# Patient Record
Sex: Male | Born: 1990 | Race: Black or African American | Hispanic: No | Marital: Single | Smoking: Former smoker
Health system: Southern US, Community
[De-identification: ages and names within clinical notes are randomized; demographics above are authoritative.]

---
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
## 2010-12-04 NOTE — Op Note (Signed)
NAME:  Aaron Shepard, Aaron Shepard NO.:  [id number]   MEDICAL RECORD NO.:  [id number]                   PATIENT TYPE:  AMB   LOCATION:  [facility]                                 FACILITY:  [facility]   PHYSICIAN:  [place] Service, M.D.             DATE OF BIRTH:  [birth year]   DATE OF PROCEDURE:  09/06/2003  DATE OF DISCHARGE:                                 OPERATIVE REPORT   PEDIATRICIAN:  Marda Stalker, M.D.   UROLOGIST:  [place] Service, M.D.   PREOPERATIVE DIAGNOSES:  Thin deflected urinary stream with dysuria,  frequency and a poorly emptying bladder. Physical exam shows meatal  stenosis, the patient also has nocturnal and uresis.   POSTOPERATIVE DIAGNOSES:  Same.   PROCEDURE:  Meatotomy, I&O cath.   ANESTHESIA:  General.   DRAINS:  None.   COMPLICATIONS:  None.   DESCRIPTION OF PROCEDURE:  The patient was prepped and draped in the supine  position after institution of an adequate level of general anesthesia.  Physical exam showed bilaterally descended testes, no evidence of inguinal  hernia.  The patient had a pinpoint urethral meatus that was actually  difficult to identify. Careful palpation with fine hemostats demonstrated  the meatus, it was gently stretched and then the fine pediatric hemostat was  placed along the ventral aspect of the urethral meatus, left in place for  five minutes and then released, replaced with an adult hemostat which was  left in place for five minutes and then released and replaced with an adult  needle driver which was left in place for five minutes and then released.  A  thin strip of avascular tissue had been created along the ventral aspect of  the urethral meatus.  It was incised with fine [place] scissors in order to  create an adequate urethral meatus, no stitches were required.  In and out  cath with a well lubricated 8 French red rubber catheter showed no evidence  of proximal urethral stenosis.  The wound was  covered with bacitracin  ointment.  The patient was returned to recovery in satisfactory condition.                                               Boston Service, M.D.    RH/MEDQ  D:  09/06/2003  T:  09/06/2003  Job:  [id number]   cc:   Marda Stalker, M.D.

## 2010-12-23 ENCOUNTER — Emergency Department (HOSPITAL_COMMUNITY)
Admission: EM | Admit: 2010-12-23 | Discharge: 2010-12-23 | Disposition: A | Discharge status: Home / Self Care | Payer: No Typology Code available for payment source | Attending: Emergency Medicine | Admitting: Emergency Medicine

## 2010-12-23 ENCOUNTER — Emergency Department (HOSPITAL_COMMUNITY): Payer: Self-pay

## 2010-12-23 DIAGNOSIS — S60229A Contusion of unspecified hand, initial encounter: Secondary | ICD-10-CM | POA: Insufficient documentation

## 2010-12-23 DIAGNOSIS — S93409A Sprain of unspecified ligament of unspecified ankle, initial encounter: Secondary | ICD-10-CM | POA: Insufficient documentation

## 2010-12-23 DIAGNOSIS — IMO0002 Reserved for concepts with insufficient information to code with codable children: Secondary | ICD-10-CM | POA: Insufficient documentation

## 2010-12-23 DIAGNOSIS — M25579 Pain in unspecified ankle and joints of unspecified foot: Secondary | ICD-10-CM | POA: Insufficient documentation

## 2011-01-05 ENCOUNTER — Emergency Department (HOSPITAL_COMMUNITY)
Admission: EM | Admit: 2011-01-05 | Discharge: 2011-01-05 | Disposition: A | Discharge status: Home / Self Care | Payer: Self-pay | Attending: Emergency Medicine | Admitting: Emergency Medicine

## 2011-01-05 DIAGNOSIS — L738 Other specified follicular disorders: Secondary | ICD-10-CM | POA: Insufficient documentation

## 2011-01-05 DIAGNOSIS — R21 Rash and other nonspecific skin eruption: Secondary | ICD-10-CM | POA: Insufficient documentation

## 2011-01-05 DIAGNOSIS — IMO0001 Reserved for inherently not codable concepts without codable children: Secondary | ICD-10-CM | POA: Insufficient documentation

## 2011-01-27 ENCOUNTER — Emergency Department (HOSPITAL_COMMUNITY): Payer: Self-pay

## 2011-01-27 ENCOUNTER — Emergency Department (HOSPITAL_COMMUNITY)
Admission: EM | Admit: 2011-01-27 | Discharge: 2011-01-27 | Disposition: A | Discharge status: Home / Self Care | Payer: Self-pay | Attending: Emergency Medicine | Admitting: Emergency Medicine

## 2011-01-27 DIAGNOSIS — L02519 Cutaneous abscess of unspecified hand: Secondary | ICD-10-CM | POA: Insufficient documentation

## 2011-01-27 DIAGNOSIS — Z79899 Other long term (current) drug therapy: Secondary | ICD-10-CM | POA: Insufficient documentation

## 2011-01-27 DIAGNOSIS — L03019 Cellulitis of unspecified finger: Secondary | ICD-10-CM | POA: Insufficient documentation

## 2011-02-08 LAB — DIFFERENTIAL
Basophils Absolute: 0 10*3/uL (ref 0.0–0.1)
Basophils Relative: 0 % (ref 0–1)
Eosinophils Absolute: 0.2 10*3/uL (ref 0.0–0.7)
Eosinophils Relative: 1 % (ref 0–5)
Lymphocytes Relative: 10 % — ABNORMAL LOW (ref 12–46)
Lymphs Abs: 1.7 10*3/uL (ref 0.7–4.0)
Monocytes Absolute: 1.8 10*3/uL — ABNORMAL HIGH (ref 0.1–1.0)
Monocytes Relative: 11 % (ref 3–12)
Neutro Abs: 13.8 10*3/uL — ABNORMAL HIGH (ref 1.7–7.7)
Neutrophils Relative %: 79 % — ABNORMAL HIGH (ref 43–77)

## 2011-02-08 LAB — CK TOTAL AND CKMB (NOT AT ARMC)
CK, MB: 1 ng/mL (ref 0.3–4.0)
Total CK: 24 U/L (ref 7–232)

## 2011-02-08 LAB — CBC
HCT: 29 % — ABNORMAL LOW (ref 39.0–52.0)
Hemoglobin: 9.3 g/dL — ABNORMAL LOW (ref 13.0–17.0)
MCH: 25.5 pg — ABNORMAL LOW (ref 26.0–34.0)
RBC: 3.64 MIL/uL — ABNORMAL LOW (ref 4.22–5.81)

## 2011-02-08 LAB — APTT: aPTT: 173 seconds — ABNORMAL HIGH (ref 24–37)

## 2011-02-08 LAB — PROTIME-INR
INR: 1.41 (ref 0.00–1.49)
Prothrombin Time: 17.5 seconds — ABNORMAL HIGH (ref 11.6–15.2)

## 2013-11-19 ENCOUNTER — Emergency Department (HOSPITAL_COMMUNITY): Payer: No Typology Code available for payment source

## 2013-11-19 ENCOUNTER — Encounter (HOSPITAL_COMMUNITY): Payer: Self-pay | Admitting: Emergency Medicine

## 2013-11-19 DIAGNOSIS — Y929 Unspecified place or not applicable: Secondary | ICD-10-CM | POA: Insufficient documentation

## 2013-11-19 DIAGNOSIS — Y939 Activity, unspecified: Secondary | ICD-10-CM | POA: Insufficient documentation

## 2013-11-19 DIAGNOSIS — S62319A Displaced fracture of base of unspecified metacarpal bone, initial encounter for closed fracture: Secondary | ICD-10-CM | POA: Insufficient documentation

## 2013-11-19 DIAGNOSIS — W2209XA Striking against other stationary object, initial encounter: Secondary | ICD-10-CM | POA: Insufficient documentation

## 2013-11-19 DIAGNOSIS — F17200 Nicotine dependence, unspecified, uncomplicated: Secondary | ICD-10-CM | POA: Insufficient documentation
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
## 2013-11-20 ENCOUNTER — Emergency Department (HOSPITAL_COMMUNITY)
Admission: EM | Admit: 2013-11-20 | Discharge: 2013-11-20 | Disposition: A | Discharge status: Home / Self Care | Payer: Self-pay | Attending: Emergency Medicine | Admitting: Emergency Medicine

## 2013-11-20 DIAGNOSIS — S62300A Unspecified fracture of second metacarpal bone, right hand, initial encounter for closed fracture: Secondary | ICD-10-CM

## 2013-11-20 MED ORDER — HYDROCODONE-ACETAMINOPHEN 5-325 MG PO TABS
1.0000 | ORAL_TABLET | Freq: Once | ORAL | Status: AC
  Administered 2013-11-20: 1 via ORAL
  Filled 2013-11-20: qty 1

## 2013-11-20 MED ORDER — HYDROCODONE-ACETAMINOPHEN 5-325 MG PO TABS: 1.0000 | ORAL_TABLET | ORAL | Status: DC | PRN

## 2013-11-20 NOTE — ED Notes (Signed)
Ortho responded.  

## 2013-11-20 NOTE — ED Notes (Signed)
Ortho paged. 

## 2013-11-20 NOTE — Discharge Instructions (Signed)
Call and make an appointment to followup with the hand surgeon. Return immediately for worsening pain, swelling, numbness, weakness or for any concerns.  Hand Fracture Your caregiver has diagnosed you with a fractured (broken) bone in your hand. If the bones are in good position and the hand is properly immobilized and rested, these injuries will usually heal in 3 to 6 weeks. A cast, splint, or bulky bandage is usually applied to keep the fracture site from moving. Do not remove the splint or cast until your caregiver approves. If the fracture is unstable or the bones are not aligned properly, surgery may be needed. Keep your hand raised (elevated) above the level of your heart as much as possible for the next 2 to 3 days until the swelling and pain are better. Apply ice packs for 15-20 minutes every 3 to 4 hours to help control the pain and swelling. See your caregiver or an orthopedic specialist as directed for follow-up care to make sure the fracture is beginning to heal properly. SEEK IMMEDIATE MEDICAL CARE IF:   You notice your fingers are cold, numb, crooked, or the pain of your injury is severe.  You are not improving or seem to be getting worse.  You have questions or concerns. Document Released: 08/12/2004 Document Revised: 09/27/2011 Document Reviewed: 12/31/2008 [facility] Patient Information 2014 [place], [place].

## 2013-11-20 NOTE — Progress Notes (Signed)
Orthopedic Tech Progress Note Patient Details:  Aaron Shepard 10/11/1990 [id number]  Ortho Devices Type of Ortho Device: Arm sling;Volar splint;Thumb spica splint   [name] 11/20/2013, 2:34 AM

## 2013-11-27 NOTE — ED Provider Notes (Signed)
CSN: [id number]     Arrival date & time 11/19/13  2151 History   First MD Initiated Contact with Patient 11/20/13 0150     Chief Complaint  Patient presents with  . Hand Injury     (Consider location/radiation/quality/duration/timing/severity/associated sxs/prior Treatment) HPI Patient presents with right hand pain after punching a wall yesterday. He's had swelling and pain over the base of the first and second metacarpals. He denies any numbness or tingling. Denies any other injury. History reviewed. No pertinent past medical history. History reviewed. No pertinent past surgical history. No family history on file. History  Substance Use Topics  . Smoking status: Current Every Day Smoker  . Smokeless tobacco: Not on file  . Alcohol Use: No    Review of Systems  Musculoskeletal: Positive for arthralgias. Negative for myalgias, neck pain and neck stiffness.  Skin: Negative for rash and wound.  Neurological: Negative for weakness and numbness.  All other systems reviewed and are negative.     Allergies  Review of patient's allergies indicates no known allergies.  Home Medications   Prior to Admission medications   Medication Sig Start Date End Date Taking? Authorizing Provider  HYDROcodone-acetaminophen (NORCO) 5-325 MG per tablet Take 1 tablet by mouth every 4 (four) hours as needed. 11/20/13   Loren Raceravid Malacai Grantz, MD   BP 124/74  Pulse 89  Temp(Src) 97.9 F (36.6 C) (Oral)  Resp 16  Ht 6' (1.829 m)  Wt 170 lb (77.111 kg)  BMI 23.05 kg/m2  SpO2 99% Physical Exam  Nursing note and vitals reviewed. Constitutional: He is oriented to person, place, and time. He appears well-developed and well-nourished. No distress.  HENT:  Head: Normocephalic and atraumatic.  Eyes: Pupils are equal, round, and reactive to light.  Neck: Normal range of motion. Neck supple.  Cardiovascular: Normal rate and regular rhythm.   Pulmonary/Chest: Effort normal.  Abdominal: Soft.   Musculoskeletal: Normal range of motion. He exhibits tenderness. He exhibits no edema.  Patient with focal tenderness at the base of the right second metacarpal. There is swelling at the site. Distal cap refill intact.  Neurological: He is alert and oriented to person, place, and time.  Range of motion of the right hand and fingers limited due to pain. No focal weakness or numbness noted.  Skin: Skin is warm and dry. No rash noted. No erythema.  Psychiatric: He has a normal mood and affect. His behavior is normal.    ED Course  Procedures (including critical care time) Labs Review Labs Reviewed - No data to display  Imaging Review No results found.   EKG Interpretation None      MDM   Final diagnoses:  Fracture of second metacarpal bone of right hand    Patient splinted and advised to followup with hand surgery. Return precautions given.   Loren Raceravid Heide Brossart, MD 11/27/13 [id number]

## 2013-12-25 ENCOUNTER — Emergency Department (HOSPITAL_COMMUNITY)
Admission: EM | Admit: 2013-12-25 | Discharge: 2013-12-25 | Disposition: A | Discharge status: Home / Self Care | Payer: No Typology Code available for payment source | Attending: Emergency Medicine | Admitting: Emergency Medicine

## 2013-12-25 ENCOUNTER — Encounter (HOSPITAL_COMMUNITY): Payer: Self-pay | Admitting: Emergency Medicine

## 2013-12-25 DIAGNOSIS — S0990XA Unspecified injury of head, initial encounter: Secondary | ICD-10-CM | POA: Insufficient documentation

## 2013-12-25 DIAGNOSIS — S161XXA Strain of muscle, fascia and tendon at neck level, initial encounter: Secondary | ICD-10-CM

## 2013-12-25 DIAGNOSIS — Y9389 Activity, other specified: Secondary | ICD-10-CM | POA: Insufficient documentation

## 2013-12-25 DIAGNOSIS — S139XXA Sprain of joints and ligaments of unspecified parts of neck, initial encounter: Secondary | ICD-10-CM | POA: Insufficient documentation

## 2013-12-25 DIAGNOSIS — F17200 Nicotine dependence, unspecified, uncomplicated: Secondary | ICD-10-CM | POA: Insufficient documentation

## 2013-12-25 DIAGNOSIS — G44209 Tension-type headache, unspecified, not intractable: Secondary | ICD-10-CM

## 2013-12-25 DIAGNOSIS — Y92410 Unspecified street and highway as the place of occurrence of the external cause: Secondary | ICD-10-CM | POA: Insufficient documentation

## 2013-12-25 MED ORDER — CYCLOBENZAPRINE HCL 10 MG PO TABS: 10.0000 mg | ORAL_TABLET | Freq: Two times a day (BID) | ORAL | Status: DC | PRN

## 2013-12-25 MED ORDER — IBUPROFEN 800 MG PO TABS: 800.0000 mg | ORAL_TABLET | Freq: Three times a day (TID) | ORAL | Status: DC
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
## 2013-12-25 NOTE — Discharge Instructions (Signed)
Cervical Sprain °A cervical sprain is an injury in the neck in which the strong, fibrous tissues (ligaments) that connect your neck bones stretch or tear. Cervical sprains can range from mild to severe. Severe cervical sprains can cause the neck vertebrae to be unstable. This can lead to damage of the spinal cord and can result in serious nervous system problems. The amount of time it takes for a cervical sprain to get better depends on the cause and extent of the injury. Most cervical sprains heal in 1 to 3 weeks. °CAUSES  °Severe cervical sprains may be caused by:  °· Contact sport injuries (such as from football, rugby, wrestling, hockey, auto racing, gymnastics, diving, martial arts, or boxing).   °· Motor vehicle collisions.   °· Whiplash injuries. This is an injury from a sudden forward-and backward whipping movement of the head and neck.  °· Falls.   °Mild cervical sprains may be caused by:  °· Being in an awkward position, such as while cradling a telephone between your ear and shoulder.   °· Sitting in a chair that does not offer proper support.   °· Working at a poorly designed computer station.   °· Looking up or down for long periods of time.   °SYMPTOMS  °· Pain, soreness, stiffness, or a burning sensation in the front, back, or sides of the neck. This discomfort may develop immediately after the injury or slowly, 24 hours or more after the injury.   °· Pain or tenderness directly in the middle of the back of the neck.   °· Shoulder or upper back pain.   °· Limited ability to move the neck.   °· Headache.   °· Dizziness.   °· Weakness, numbness, or tingling in the hands or arms.   °· Muscle spasms.   °· Difficulty swallowing or chewing.   °· Tenderness and swelling of the neck.   °DIAGNOSIS  °Most of the time your health care provider can diagnose a cervical sprain by taking your history and doing a physical exam. Your health care provider will ask about previous neck injuries and any known neck  problems, such as arthritis in the neck. X-rays may be taken to find out if there are any other problems, such as with the bones of the neck. Other tests, such as a CT scan or MRI, may also be needed.  °TREATMENT  °Treatment depends on the severity of the cervical sprain. Mild sprains can be treated with rest, keeping the neck in place (immobilization), and pain medicines. Severe cervical sprains are immediately immobilized. Further treatment is done to help with pain, muscle spasms, and other symptoms and may include: °· Medicines, such as pain relievers, numbing medicines, or muscle relaxants.   °· Physical therapy. This may involve stretching exercises, strengthening exercises, and posture training. Exercises and improved posture can help stabilize the neck, strengthen muscles, and help stop symptoms from returning.   °HOME CARE INSTRUCTIONS  °· Put ice on the injured area.   °· Put ice in a plastic bag.   °· Place a towel between your skin and the bag.   °· Leave the ice on for 15 20 minutes, 3 4 times a day.   °· If your injury was severe, you may have been given a cervical collar to wear. A cervical collar is a two-piece collar designed to keep your neck from moving while it heals. °· Do not remove the collar unless instructed by your health care provider. °· If you have long hair, keep it outside of the collar. °· Ask your health care provider before making any adjustments to your collar.   Minor adjustments may be required over time to improve comfort and reduce pressure on your chin or on the back of your head.  Ifyou are allowed to remove the collar for cleaning or bathing, follow your health care provider's instructions on how to do so safely.  Keep your collar clean by wiping it with mild soap and water and drying it completely. If the collar you have been given includes removable pads, remove them every 1 2 days and hand wash them with soap and water. Allow them to air dry. They should be completely  dry before you wear them in the collar.  If you are allowed to remove the collar for cleaning and bathing, wash and dry the skin of your neck. Check your skin for irritation or sores. If you see any, tell your health care provider.  Do not drive while wearing the collar.   Only take over-the-counter or prescription medicines for pain, discomfort, or fever as directed by your health care provider.   Keep all follow-up appointments as directed by your health care provider.   Keep all physical therapy appointments as directed by your health care provider.   Make any needed adjustments to your workstation to promote good posture.   Avoid positions and activities that make your symptoms worse.   Warm up and stretch before being active to help prevent problems.  SEEK MEDICAL CARE IF:   Your pain is not controlled with medicine.   You are unable to decrease your pain medicine over time as planned.   Your activity level is not improving as expected.  SEEK IMMEDIATE MEDICAL CARE IF:   You develop any bleeding.  You develop stomach upset.  You have signs of an allergic reaction to your medicine.   Your symptoms get worse.   You develop new, unexplained symptoms.   You have numbness, tingling, weakness, or paralysis in any part of your body.  MAKE SURE YOU:   Understand these instructions.  Will watch your condition.  Will get help right away if you are not doing well or get worse. Document Released: 05/02/2007 Document Revised: 04/25/2013 Document Reviewed: 01/10/2013 [facility] Patient Information 2014 [place].  Cryotherapy Cryotherapy means treatment with cold. Ice or gel packs can be used to reduce both pain and swelling. Ice is the most helpful within the first 24 to 48 hours after an injury or flareup from overusing a muscle or joint. Sprains, strains, spasms, burning pain, shooting pain, and aches can all be eased with ice. Ice can also be used when  recovering from surgery. Ice is effective, has very few side effects, and is safe for most people to use. PRECAUTIONS  Ice is not a safe treatment option for people with:  Raynaud's phenomenon. This is a condition affecting small blood vessels in the extremities. Exposure to cold may cause your problems to return.  Cold hypersensitivity. There are many forms of cold hypersensitivity, including:  Cold urticaria. Red, itchy hives appear on the skin when the tissues begin to warm after being iced.  Cold erythema. This is a red, itchy rash caused by exposure to cold.  Cold hemoglobinuria. Red blood cells break down when the tissues begin to warm after being iced. The hemoglobin that carry oxygen are passed into the urine because they cannot combine with blood proteins fast enough.  Numbness or altered sensitivity in the area being iced. If you have any of the following conditions, do not use ice until you have discussed cryotherapy with  your caregiver:  Heart conditions, such as arrhythmia, angina, or chronic heart disease.  High blood pressure.  Healing wounds or open skin in the area being iced.  Current infections.  Rheumatoid arthritis.  Poor circulation.  Diabetes. Ice slows the blood flow in the region it is applied. This is beneficial when trying to stop inflamed tissues from spreading irritating chemicals to surrounding tissues. However, if you expose your skin to cold temperatures for too long or without the proper protection, you can damage your skin or nerves. Watch for signs of skin damage due to cold. HOME CARE INSTRUCTIONS Follow these tips to use ice and cold packs safely.  Place a dry or damp towel between the ice and skin. A damp towel will cool the skin more quickly, so you may need to shorten the time that the ice is used.  For a more rapid response, add gentle compression to the ice.  Ice for no more than 10 to 20 minutes at a time. The bonier the area you are  icing, the less time it will take to get the benefits of ice.  Check your skin after 5 minutes to make sure there are no signs of a poor response to cold or skin damage.  Rest 20 minutes or more in between uses.  Once your skin is numb, you can end your treatment. You can test numbness by very lightly touching your skin. The touch should be so light that you do not see the skin dimple from the pressure of your fingertip. When using ice, most people will feel these normal sensations in this order: cold, burning, aching, and numbness.  Do not use ice on someone who cannot communicate their responses to pain, such as small children or people with dementia. HOW TO MAKE AN ICE PACK Ice packs are the most common way to use ice therapy. Other methods include ice massage, ice baths, and cryo-sprays. Muscle creams that cause a cold, tingly feeling do not offer the same benefits that ice offers and should not be used as a substitute unless recommended by your caregiver. To make an ice pack, do one of the following:  Place crushed ice or a bag of frozen vegetables in a sealable plastic bag. Squeeze out the excess air. Place this bag inside another plastic bag. Slide the bag into a pillowcase or place a damp towel between your skin and the bag.  Mix 3 parts water with 1 part rubbing alcohol. Freeze the mixture in a sealable plastic bag. When you remove the mixture from the freezer, it will be slushy. Squeeze out the excess air. Place this bag inside another plastic bag. Slide the bag into a pillowcase or place a damp towel between your skin and the bag. SEEK MEDICAL CARE IF:  You develop white spots on your skin. This may give the skin a blotchy (mottled) appearance.  Your skin turns blue or pale.  Your skin becomes waxy or hard.  Your swelling gets worse. MAKE SURE YOU:   Understand these instructions.  Will watch your condition.  Will get help right away if you are not doing well or get  worse. Document Released: 03/01/2011 Document Revised: 09/27/2011 Document Reviewed: 03/01/2011 [facility] Patient Information 2014 [place], [place]. Tension Headache A tension headache is a feeling of pain, pressure, or aching often felt over the front and sides of the head. The pain can be dull or can feel tight (constricting). It is the most common type of headache. Tension headaches  are not normally associated with nausea or vomiting and do not get worse with physical activity. Tension headaches can last 30 minutes to several days.  CAUSES  The exact cause is not known, but it may be caused by chemicals and hormones in the brain that lead to pain. Tension headaches often begin after stress, anxiety, or depression. Other triggers may include:  Alcohol.  Caffeine (too much or withdrawal).  Respiratory infections (colds, flu, sinus infections).  Dental problems or teeth clenching.  Fatigue.  Holding your head and neck in one position too long while using a computer. SYMPTOMS   Pressure around the head.   Dull, aching head pain.   Pain felt over the front and sides of the head.   Tenderness in the muscles of the head, neck, and shoulders. DIAGNOSIS  A tension headache is often diagnosed based on:   Symptoms.   Physical examination.   A CT scan or MRI of your head. These tests may be ordered if symptoms are severe or unusual. TREATMENT  Medicines may be given to help relieve symptoms.  HOME CARE INSTRUCTIONS   Only take over-the-counter or prescription medicines for pain or discomfort as directed by your caregiver.   Lie down in a dark, quiet room when you have a headache.   Keep a journal to find out what may be triggering your headaches. For example, write down:  What you eat and drink.  How much sleep you get.  Any change to your diet or medicines.  Try massage or other relaxation techniques.   Ice packs or heat applied to the head and neck can be used.  Use these 3 to 4 times per day for 15 to 20 minutes each time, or as needed.   Limit stress.   Sit up straight, and do not tense your muscles.   Quit smoking if you smoke.  Limit alcohol use.  Decrease the amount of caffeine you drink, or stop drinking caffeine.  Eat and exercise regularly.  Get 7 to 9 hours of sleep, or as recommended by your caregiver.  Avoid excessive use of pain medicine as recurrent headaches can occur.  SEEK MEDICAL CARE IF:   You have problems with the medicines you were prescribed.  Your medicines do not work.  You have a change from the usual headache.  You have nausea or vomiting. SEEK IMMEDIATE MEDICAL CARE IF:   Your headache becomes severe.  You have a fever.  You have a stiff neck.  You have loss of vision.  You have muscular weakness or loss of muscle control.  You lose your balance or have trouble walking.  You feel faint or pass out.  You have severe symptoms that are different from your first symptoms. MAKE SURE YOU:   Understand these instructions.  Will watch your condition.  Will get help right away if you are not doing well or get worse. Document Released: 07/05/2005 Document Revised: 09/27/2011 Document Reviewed: 06/25/2011 [facility] Patient Information 2014 [place].

## 2013-12-26 NOTE — ED Provider Notes (Signed)
Medical screening examination/treatment/procedure(s) were performed by non-physician practitioner and as supervising physician I was immediately available for consultation/collaboration.     Suzi Roots, MD 12/26/13 [phone]

## 2015-05-28 ENCOUNTER — Encounter (HOSPITAL_COMMUNITY): Payer: Self-pay | Admitting: *Deleted

## 2015-05-28 ENCOUNTER — Emergency Department (HOSPITAL_COMMUNITY)
Admission: EM | Admit: 2015-05-28 | Discharge: 2015-05-28 | Disposition: A | Discharge status: Home / Self Care | Payer: Self-pay | Attending: Emergency Medicine | Admitting: Emergency Medicine

## 2015-05-28 DIAGNOSIS — Y9289 Other specified places as the place of occurrence of the external cause: Secondary | ICD-10-CM | POA: Insufficient documentation

## 2015-05-28 DIAGNOSIS — Y288XXA Contact with other sharp object, undetermined intent, initial encounter: Secondary | ICD-10-CM | POA: Insufficient documentation

## 2015-05-28 DIAGNOSIS — S61411A Laceration without foreign body of right hand, initial encounter: Secondary | ICD-10-CM | POA: Insufficient documentation

## 2015-05-28 DIAGNOSIS — Y998 Other external cause status: Secondary | ICD-10-CM | POA: Insufficient documentation

## 2015-05-28 DIAGNOSIS — Z720 Tobacco use: Secondary | ICD-10-CM | POA: Insufficient documentation

## 2015-05-28 DIAGNOSIS — Y9389 Activity, other specified: Secondary | ICD-10-CM | POA: Insufficient documentation

## 2015-05-28 MED ORDER — BACITRACIN ZINC 500 UNIT/GM EX OINT
TOPICAL_OINTMENT | CUTANEOUS | Status: AC
  Administered 2015-05-28: 1
  Filled 2015-05-28: qty 0.9

## 2015-05-28 MED ORDER — CEPHALEXIN 500 MG PO CAPS: 500.0000 mg | ORAL_CAPSULE | Freq: Four times a day (QID) | ORAL | Status: DC

## 2015-05-28 MED ORDER — CEPHALEXIN 500 MG PO CAPS
500.0000 mg | ORAL_CAPSULE | Freq: Once | ORAL | Status: AC
  Administered 2015-05-28: 500 mg via ORAL
  Filled 2015-05-28: qty 1

## 2015-05-28 NOTE — ED Notes (Signed)
Pt reports he cut his right palm on some rusty metal 3 days ago. Pain 8/10.

## 2015-05-28 NOTE — Discharge Instructions (Signed)
Nonsutured Laceration Care °A laceration is a cut that goes through all layers of the skin and extends into the tissue that is right under the skin. This type of cut is usually stitched up (sutured) or closed with tape (adhesive strips) or skin glue shortly after the injury happens. °However, if the wound is dirty or if several hours pass before medical treatment is provided, it is likely that germs (bacteria) will enter the wound. Closing a laceration after bacteria have entered it increases the risk of infection. In these cases, your health care provider may leave the laceration open (nonsutured) and cover it with a bandage. This type of treatment helps prevent infection and allows the wound to heal from the deepest layer of tissue damage up to the surface. °An open fracture is a type of injury that may involve nonsutured lacerations. An open fracture is a break in a bone that happens along with one or more lacerations through the skin that is near the fracture site. °HOW TO CARE FOR YOUR NONSUTURED LACERATION °· Take or apply over-the-counter and prescription medicines only as told by your health care provider. °· If you were prescribed an antibiotic medicine, take or apply it as told by your health care provider. Do not stop using the antibiotic even if your condition improves. °· Clean the wound one time each day or as told by your health care provider. °¨ Wash the wound with mild soap and water. °¨ Rinse the wound with water to remove all soap. °¨ Pat your wound dry with a clean towel. Do not rub the wound. °· Do not inject anything into the wound unless your health care provider told you to. °· Change any bandages (dressings) as told by your health care provider. This includes changing the dressing if it gets wet, dirty, or starts to smell bad. °· Keep the dressing dry until your health care provider says it can be removed. Do not take baths, swim, or do anything that puts your wound underwater until your  health care provider approves. °· Raise (elevate) the injured area above the level of your heart while you are sitting or lying down, if possible. °· Do not scratch or pick at the wound. °· Check your wound every day for signs of infection. Watch for: °¨ Redness, swelling, or pain. °¨ Fluid, blood, or pus. °· Keep all follow-up visits as told by your health care provider. This is important. °SEEK MEDICAL CARE IF: °· You received a tetanus and shot and you have swelling, severe pain, redness, or bleeding at the injection site.   °· You have a fever. °· Your pain is not controlled with medicine. °· You have increased redness, swelling, or pain at the site of your wound. °· You have fluid, blood, or pus coming from your wound. °· You notice a bad smell coming from your wound or your dressing. °· You notice something coming out of the wound, such as wood or glass. °· You notice a change in the color of your skin near your wound. °· You develop a new rash. °· You need to change the dressing frequently due to fluid, blood, or pus draining from the wound. °· You develop numbness around your wound. °SEEK IMMEDIATE MEDICAL CARE IF: °· Your pain suddenly increases and is severe. °· You develop severe swelling around the wound. °· The wound is on your hand or foot and you cannot properly move a finger or toe. °· The wound is on your hand or   foot and you notice that your fingers or toes look pale or bluish. °· You have a red streak going away from your wound. °  °This information is not intended to replace advice given to you by your health care provider. Make sure you discuss any questions you have with your health care provider. °  °Document Released: 06/02/2006 Document Revised: 11/19/2014 Document Reviewed: 07/01/2014 °Elsevier Interactive Patient Education ©2016 [employer] ° °

## 2015-05-28 NOTE — ED Provider Notes (Signed)
CSN: [id number]     Arrival date & time 05/28/15  1859 History  By signing my name below, I, Soijett Blue, attest that this documentation has been prepared under the direction and in the presence of Elpidio AnisShari Finnleigh Marchetti, PA-C Electronically Signed: Soijett Blue, ED Scribe. 05/28/2015. 8:54 PM.   Chief Complaint  Patient presents with  . Hand Laceration       The history is provided by the patient. No language interpreter was used.    Aaron Shepard is a 24 y.o. male who presents to the Emergency Department complaining of right palm laceration onset 3 days ago. He reports that he cut the base of his right palm on rusty aluminum metal. He states that he is UTD on his tetanus with the last one being in 2013-2014. He states that he didn't come in initially because he didn't think that the wound was too bad. He states that he is having associated symptoms of wound. He states that he has not tried any medications for the relief for his symptoms. He denies color change, joint swelling, and any other symptoms. Denies allergies to medications.   History reviewed. No pertinent past medical history. History reviewed. No pertinent past surgical history. History reviewed. No pertinent family history. Social History  Substance Use Topics  . Smoking status: Current Every Day Smoker  . Smokeless tobacco: None  . Alcohol Use: No    Review of Systems  Musculoskeletal: Negative for joint swelling and arthralgias.  Skin: Positive for wound. Negative for color change.      Allergies  Review of patient's allergies indicates no known allergies.  Home Medications   Prior to Admission medications   Medication Sig Start Date End Date Taking? Authorizing Provider  cyclobenzaprine (FLEXERIL) 10 MG tablet Take 1 tablet (10 mg total) by mouth 2 (two) times daily as needed for muscle spasms. Patient not taking: Reported on 05/28/2015 12/25/13   Elpidio AnisShari Demitrious Mccannon, PA-C  HYDROcodone-acetaminophen (NORCO) 5-325 MG per  tablet Take 1 tablet by mouth every 4 (four) hours as needed. Patient not taking: Reported on 05/28/2015 11/20/13   Loren Raceravid Yelverton, MD  ibuprofen (ADVIL,MOTRIN) 800 MG tablet Take 1 tablet (800 mg total) by mouth 3 (three) times daily. Patient not taking: Reported on 05/28/2015 12/25/13   Elpidio AnisShari Aubreana Cornacchia, PA-C   BP 148/81 mmHg  Pulse 89  Temp(Src) 98.1 F (36.7 C) (Oral)  Resp 16  SpO2 100% Physical Exam  Constitutional: He is oriented to person, place, and time. He appears well-developed and well-nourished. No distress.  HENT:  Head: Normocephalic and atraumatic.  Eyes: EOM are normal.  Neck: Neck supple.  Cardiovascular: Normal rate.   Pulmonary/Chest: Effort normal. No respiratory distress.  Musculoskeletal: Normal range of motion.  Neurological: He is alert and oriented to person, place, and time.  Skin: Skin is warm and dry. Laceration noted. No erythema.  Right hand: 3 cm full thickness wound central palmar base of the hand. No surrounding redness or purulent drainage.  Psychiatric: He has a normal mood and affect. His behavior is normal.  Nursing note and vitals reviewed.   ED Course  Procedures (including critical care time) DIAGNOSTIC STUDIES: Oxygen Saturation is 100% on RA, nl by my interpretation.    COORDINATION OF CARE: 8:53 PM Discussed treatment plan with pt at bedside which includes abx Rx, wound care, and pt agreed to plan.    Labs Review Labs Reviewed - No data to display  Imaging Review No results found.    EKG  Interpretation None      MDM   Final diagnoses:  None    1. Laceration hand, delayed presentation  Tetanus provided. Discussed that repair of the laceration would not be recommended due to age of the wound. Wound care instructions provided.   I personally performed the services described in this documentation, which was scribed in my presence. The recorded information has been reviewed and is accurate.     Elpidio Anis,  PA-C 06/02/15 [id number]  Leta Baptist, MD 06/03/15 [phone]

## 2018-06-13 ENCOUNTER — Encounter (HOSPITAL_COMMUNITY): Payer: Self-pay

## 2018-06-13 ENCOUNTER — Ambulatory Visit (HOSPITAL_COMMUNITY)
Admission: EM | Admit: 2018-06-13 | Discharge: 2018-06-13 | Disposition: A | Discharge status: Home / Self Care | Payer: Self-pay | Attending: Family Medicine | Admitting: Family Medicine

## 2018-06-13 DIAGNOSIS — R21 Rash and other nonspecific skin eruption: Secondary | ICD-10-CM | POA: Insufficient documentation

## 2018-06-13 DIAGNOSIS — G44209 Tension-type headache, unspecified, not intractable: Secondary | ICD-10-CM | POA: Insufficient documentation

## 2018-06-13 DIAGNOSIS — F17200 Nicotine dependence, unspecified, uncomplicated: Secondary | ICD-10-CM | POA: Insufficient documentation

## 2018-06-13 MED ORDER — BUTALBITAL-APAP-CAFFEINE 50-325-40 MG PO TABS: 1.0000 | ORAL_TABLET | Freq: Four times a day (QID) | ORAL | 0 refills | Status: AC | PRN

## 2018-06-13 MED ORDER — DEXAMETHASONE SODIUM PHOSPHATE 10 MG/ML IJ SOLN
10.0000 mg | Freq: Once | INTRAMUSCULAR | Status: AC
  Administered 2018-06-13: 10 mg via INTRAMUSCULAR

## 2018-06-13 MED ORDER — ONDANSETRON HCL 4 MG/2ML IJ SOLN
4.0000 mg | Freq: Once | INTRAMUSCULAR | Status: AC
  Administered 2018-06-13: 4 mg via INTRAMUSCULAR

## 2018-06-13 MED ORDER — ONDANSETRON HCL 4 MG/2ML IJ SOLN
INTRAMUSCULAR | Status: AC
  Filled 2018-06-13: qty 2

## 2018-06-13 MED ORDER — KETOROLAC TROMETHAMINE 60 MG/2ML IM SOLN
INTRAMUSCULAR | Status: AC
  Filled 2018-06-13: qty 2

## 2018-06-13 MED ORDER — KETOROLAC TROMETHAMINE 60 MG/2ML IM SOLN
60.0000 mg | Freq: Once | INTRAMUSCULAR | Status: AC
  Administered 2018-06-13: 60 mg via INTRAMUSCULAR

## 2018-06-13 MED ORDER — DEXAMETHASONE SODIUM PHOSPHATE 10 MG/ML IJ SOLN
INTRAMUSCULAR | Status: AC
  Filled 2018-06-13: qty 1

## 2018-06-13 MED ORDER — ONDANSETRON HCL 4 MG/2ML IJ SOLN: 8.0000 mg | Freq: Once | INTRAMUSCULAR | Status: DC

## 2018-06-13 MED ORDER — ONDANSETRON HCL 4 MG PO TABS: 4.0000 mg | ORAL_TABLET | Freq: Four times a day (QID) | ORAL | 0 refills | Status: DC

## 2018-06-13 MED ORDER — ONDANSETRON 4 MG PO TBDP
4.0000 mg | ORAL_TABLET | Freq: Once | ORAL | Status: AC
  Administered 2018-06-13: 4 mg via ORAL

## 2018-06-13 MED ORDER — ONDANSETRON 4 MG PO TBDP
ORAL_TABLET | ORAL | Status: AC
  Filled 2018-06-13: qty 1

## 2018-06-13 NOTE — Discharge Instructions (Signed)
Take the headache medicine as needed Take the nausea medicine as needed We did lab testing during this visit.  If there are any abnormal findings that require change in medicine or indicate a positive result, you will be notified.  If all of your tests are normal, you will not be called.

## 2018-06-13 NOTE — ED Triage Notes (Signed)
Pt presents with severe headache with no relief with OTC meds and rash all over body that is not painful or itchy.

## 2018-06-13 NOTE — ED Provider Notes (Signed)
[facility]    CSN: [id number] Arrival date & time: 06/13/18  [id number]     History   Chief Complaint Chief Complaint  Patient presents with  . Headache  . Rash    HPI Rosaland LaoRobert L Gottlieb is a 27 y.o. male.   HPI  Patient is here for headache.  He no history of migraines.  He states he feels it is from stress.  He has been under a lot of stress lately.  No visual symptoms or photophobia.  No nausea or vomiting.  No head injury.  No recent infection or sinus problems.  No numbness or weakness in his arms or legs, problems with balance. While he is here he would like me to check out a rash.  He states is been there for weeks.  It is all over his body including palms and soles.  Denies any other symptoms.  Denies any known other skin lesion, or painless ulcer.  Denies that he could possibly have an STD.  States he has a single sexual partner just had a baby several months ago.  History reviewed. No pertinent past medical history.  There are no active problems to display for this patient.   History reviewed. No pertinent surgical history.     Home Medications    Prior to Admission medications   Medication Sig Start Date End Date Taking? Authorizing Provider  butalbital-acetaminophen-caffeine (FIORICET, ESGIC) 50-325-40 MG tablet Take 1-2 tablets by mouth every 6 (six) hours as needed for headache. 06/13/18 06/13/19  Eustace MooreNelson, Tiarna Koppen Sue, MD  ondansetron (ZOFRAN) 4 MG tablet Take 1 tablet (4 mg total) by mouth every 6 (six) hours. 06/13/18   Eustace MooreNelson, Evalina Tabak Sue, MD    Family History History reviewed. No pertinent family history.  Social History Social History   Tobacco Use  . Smoking status: Current Every Day Smoker  Substance Use Topics  . Alcohol use: No  . Drug use: Not on file     Allergies   Patient has no known allergies.   Review of Systems Review of Systems  Constitutional: Negative for chills and fever.  HENT: Negative for dental problem, ear pain and  sore throat.   Eyes: Negative for photophobia, pain and visual disturbance.  Respiratory: Negative for cough and shortness of breath.   Cardiovascular: Negative for chest pain and palpitations.  Gastrointestinal: Negative for abdominal pain, nausea and vomiting.  Genitourinary: Negative for dysuria and hematuria.  Musculoskeletal: Negative for arthralgias and back pain.  Skin: Positive for rash. Negative for color change.  Neurological: Positive for headaches. Negative for dizziness, seizures, syncope, weakness, light-headedness and numbness.  All other systems reviewed and are negative.    Physical Exam Triage Vital Signs ED Triage Vitals  Enc Vitals Group     BP 06/13/18 0843 135/85     Pulse Rate 06/13/18 0843 75     Resp 06/13/18 0843 20     Temp 06/13/18 0843 98.1 F (36.7 C)     Temp Source 06/13/18 0843 Oral     SpO2 06/13/18 0843 99 %     Weight --      Height --      Head Circumference --      Peak Flow --      Pain Score 06/13/18 0844 9     Pain Loc --      Pain Edu? --      Excl. in GC? --    No data found.  Updated Vital Signs BP  135/85 (BP Location: Left Arm)   Pulse 75   Temp 98.1 F (36.7 C) (Oral)   Resp 20   SpO2 99%       Physical Exam  Constitutional: He is oriented to person, place, and time. He appears well-developed and well-nourished.  Non-toxic appearance. He does not appear ill. No distress.  HENT:  Head: Normocephalic and atraumatic.  Mouth/Throat: Oropharynx is clear and moist.  Eyes: Pupils are equal, round, and reactive to light. Conjunctivae and EOM are normal. Right eye exhibits normal extraocular motion and no nystagmus. Left eye exhibits normal extraocular motion and no nystagmus. Right pupil is round and reactive. Left pupil is round and reactive.  Disks are flat  Neck: Normal range of motion. Neck supple.  Cardiovascular: Normal rate, regular rhythm and normal heart sounds.  Pulmonary/Chest: Effort normal and breath sounds  normal. No respiratory distress.  Abdominal: Soft. He exhibits no distension.  Musculoskeletal: Normal range of motion. He exhibits no edema.  Neurological: He is alert and oriented to person, place, and time. He displays normal reflexes. Coordination and gait normal.  Skin: Skin is warm and dry. Rash noted.  I examined the patient's arms chest back and ankles.  He has a hyperpigmented papular rash, some are target lesions, none are open or flaking, mostly over the cervical, mostly once to 2 cm across.  He does have lesions present on palms and bottom of feet  Psychiatric: He has a normal mood and affect. His behavior is normal.     UC Treatments / Results  Labs (all labs ordered are listed, but only abnormal results are displayed) Labs Reviewed  RPR  HIV ANTIBODY (ROUTINE TESTING W REFLEX)    EKG None  Radiology No results found.  Procedures Procedures (including critical care time)  Medications Ordered in UC Medications  ketorolac (TORADOL) injection 60 mg (60 mg Intramuscular Given 06/13/18 0949)  dexamethasone (DECADRON) injection 10 mg (10 mg Intramuscular Given 06/13/18 0949)  ondansetron (ZOFRAN-ODT) disintegrating tablet 4 mg (4 mg Oral Given 06/13/18 0948)  ondansetron (ZOFRAN) injection 4 mg (4 mg Intramuscular Given 06/13/18 1016)    Initial Impression / Assessment and Plan / UC Course  I have reviewed the triage vital signs and the nursing notes.  Pertinent labs & imaging results that were available during my care of the patient were reviewed by me and considered in my medical decision making (see chart for details).     Has a headache.  No history of migraines.  No symptoms associated with migraine such as nausea or aura.  Physical exam does not reveal any neurologic symptoms.  No findings.  No signs of infection.  He was given a shot of Toradol.  Zofran for nausea (after the Toradol ) I discussed with him that his rash looked like syphilis.  He does not feel  like this is possible.  I offered him a shot of penicillin at this time.  He refused. Final Clinical Impressions(s) / UC Diagnoses   Final diagnoses:  Acute non intractable tension-type headache  Rash     Discharge Instructions     Take the headache medicine as needed Take the nausea medicine as needed We did lab testing during this visit.  If there are any abnormal findings that require change in medicine or indicate a positive result, you will be notified.  If all of your tests are normal, you will not be called.      ED Prescriptions    Medication Sig Dispense  Auth. Provider   ondansetron (ZOFRAN) 4 MG tablet Take 1 tablet (4 mg total) by mouth every 6 (six) hours. 12 tablet Eustace Moore, MD   butalbital-acetaminophen-caffeine (FIORICET, ESGIC) [phone] MG tablet Take 1-2 tablets by mouth every 6 (six) hours as needed for headache. 15 tablet Eustace Moore, MD     Controlled Substance Prescriptions [place] Controlled Substance Registry consulted? Yes, I have consulted the [place] Controlled Substances Registry for this patient, and feel the risk/benefit ratio today is favorable for proceeding with this prescription for a controlled substance.   Eustace Moore, MD 06/13/18 2131

## 2018-06-14 LAB — RPR: RPR Ser Ql: NONREACTIVE

## 2018-06-14 LAB — HIV ANTIBODY (ROUTINE TESTING W REFLEX): HIV SCREEN 4TH GENERATION: NONREACTIVE

## 2018-06-17 ENCOUNTER — Emergency Department (HOSPITAL_COMMUNITY)
Admission: EM | Admit: 2018-06-17 | Discharge: 2018-06-17 | Disposition: A | Discharge status: Home / Self Care | Payer: Self-pay | Attending: Emergency Medicine | Admitting: Emergency Medicine

## 2018-06-17 ENCOUNTER — Other Ambulatory Visit: Payer: Self-pay

## 2018-06-17 ENCOUNTER — Encounter (HOSPITAL_COMMUNITY): Payer: Self-pay | Admitting: Emergency Medicine

## 2018-06-17 ENCOUNTER — Emergency Department (HOSPITAL_COMMUNITY): Payer: Self-pay

## 2018-06-17 DIAGNOSIS — R51 Headache: Secondary | ICD-10-CM | POA: Insufficient documentation

## 2018-06-17 DIAGNOSIS — R112 Nausea with vomiting, unspecified: Secondary | ICD-10-CM | POA: Insufficient documentation

## 2018-06-17 DIAGNOSIS — E876 Hypokalemia: Secondary | ICD-10-CM | POA: Insufficient documentation

## 2018-06-17 DIAGNOSIS — F17210 Nicotine dependence, cigarettes, uncomplicated: Secondary | ICD-10-CM | POA: Insufficient documentation

## 2018-06-17 DIAGNOSIS — D72829 Elevated white blood cell count, unspecified: Secondary | ICD-10-CM | POA: Insufficient documentation

## 2018-06-17 DIAGNOSIS — R519 Headache, unspecified: Secondary | ICD-10-CM

## 2018-06-17 LAB — COMPREHENSIVE METABOLIC PANEL
ALK PHOS: 82 U/L (ref 38–126)
ALT: 34 U/L (ref 0–44)
AST: 21 U/L (ref 15–41)
Albumin: 3.8 g/dL (ref 3.5–5.0)
Anion gap: 15 (ref 5–15)
BUN: 11 mg/dL (ref 6–20)
CALCIUM: 9.5 mg/dL (ref 8.9–10.3)
CO2: 23 mmol/L (ref 22–32)
Chloride: 96 mmol/L — ABNORMAL LOW (ref 98–111)
Creatinine, Ser: 1.19 mg/dL (ref 0.61–1.24)
GFR calc Af Amer: >60 mL/min (ref >60)
GFR calc non Af Amer: >60 mL/min (ref >60)
Glucose, Bld: 104 mg/dL — ABNORMAL HIGH (ref 70–99)
Potassium: 2.8 mmol/L — ABNORMAL LOW (ref 3.5–5.1)
Sodium: 134 mmol/L — ABNORMAL LOW (ref 135–145)
Total Bilirubin: 1.3 mg/dL — ABNORMAL HIGH (ref 0.3–1.2)
Total Protein: 7.6 g/dL (ref 6.5–8.1)

## 2018-06-17 LAB — URINALYSIS, ROUTINE W REFLEX MICROSCOPIC
BACTERIA UA: NONE SEEN
Bilirubin Urine: NEGATIVE
Glucose, UA: NEGATIVE mg/dL
Hgb urine dipstick: NEGATIVE
Ketones, ur: 80 mg/dL — AB
Leukocytes, UA: NEGATIVE
Nitrite: NEGATIVE
PROTEIN: 30 mg/dL — AB
Specific Gravity, Urine: 1.028 (ref 1.005–1.030)
pH: 6 (ref 5.0–8.0)

## 2018-06-17 LAB — CBC WITH DIFFERENTIAL/PLATELET
ABS IMMATURE GRANULOCYTES: 0.08 10*3/uL — AB (ref 0.00–0.07)
BASOS ABS: 0 10*3/uL (ref 0.0–0.1)
BASOS PCT: 0 %
EOS ABS: 0 10*3/uL (ref 0.0–0.5)
Eosinophils Relative: 0 %
HCT: 45 % (ref 39.0–52.0)
Hemoglobin: 14.6 g/dL (ref 13.0–17.0)
IMMATURE GRANULOCYTES: 1 %
Lymphocytes Relative: 12 %
Lymphs Abs: 1.9 10*3/uL (ref 0.7–4.0)
MCH: 28.1 pg (ref 26.0–34.0)
MCHC: 32.4 g/dL (ref 30.0–36.0)
MCV: 86.7 fL (ref 80.0–100.0)
MONOS PCT: 11 %
Monocytes Absolute: 1.7 10*3/uL — ABNORMAL HIGH (ref 0.1–1.0)
NEUTROS ABS: 11.4 10*3/uL — AB (ref 1.7–7.7)
NEUTROS PCT: 76 %
NRBC: 0 % (ref 0.0–0.2)
PLATELETS: 435 10*3/uL — AB (ref 150–400)
RBC: 5.19 MIL/uL (ref 4.22–5.81)
RDW: 13.3 % (ref 11.5–15.5)
WBC: 15 10*3/uL — ABNORMAL HIGH (ref 4.0–10.5)

## 2018-06-17 LAB — LIPASE, BLOOD: Lipase: 25 U/L (ref 11–51)

## 2018-06-17 MED ORDER — PROMETHAZINE HCL 25 MG PO TABS: 25.0000 mg | ORAL_TABLET | Freq: Three times a day (TID) | ORAL | 0 refills | Status: DC | PRN

## 2018-06-17 MED ORDER — POTASSIUM CHLORIDE CRYS ER 20 MEQ PO TBCR: 20.0000 meq | EXTENDED_RELEASE_TABLET | Freq: Every day | ORAL | 0 refills | Status: DC

## 2018-06-17 MED ORDER — SODIUM CHLORIDE 0.9 % IV SOLN
INTRAVENOUS | Status: DC
  Administered 2018-06-17: 10:00:00 via INTRAVENOUS

## 2018-06-17 MED ORDER — BUTALBITAL-APAP-CAFFEINE 50-325-40 MG PO TABS
1.0000 | ORAL_TABLET | Freq: Once | ORAL | Status: AC
  Administered 2018-06-17: 1 via ORAL
  Filled 2018-06-17: qty 1

## 2018-06-17 MED ORDER — KETOROLAC TROMETHAMINE 15 MG/ML IJ SOLN
15.0000 mg | Freq: Once | INTRAMUSCULAR | Status: AC
  Administered 2018-06-17: 15 mg via INTRAVENOUS
  Filled 2018-06-17: qty 1

## 2018-06-17 MED ORDER — SODIUM CHLORIDE 0.9 % IV BOLUS
1000.0000 mL | Freq: Once | INTRAVENOUS | Status: AC
  Administered 2018-06-17: 1000 mL via INTRAVENOUS

## 2018-06-17 MED ORDER — NAPROXEN 500 MG PO TABS: 500.0000 mg | ORAL_TABLET | Freq: Two times a day (BID) | ORAL | 0 refills | Status: AC

## 2018-06-17 MED ORDER — POTASSIUM CHLORIDE CRYS ER 20 MEQ PO TBCR
40.0000 meq | EXTENDED_RELEASE_TABLET | Freq: Once | ORAL | Status: AC
  Administered 2018-06-17: 40 meq via ORAL
  Filled 2018-06-17: qty 2

## 2018-06-17 MED ORDER — PROCHLORPERAZINE EDISYLATE 10 MG/2ML IJ SOLN
10.0000 mg | Freq: Once | INTRAMUSCULAR | Status: AC
  Administered 2018-06-17: 10 mg via INTRAVENOUS
  Filled 2018-06-17: qty 2

## 2018-06-17 MED ORDER — POTASSIUM CHLORIDE CRYS ER 20 MEQ PO TBCR: 20.0000 meq | EXTENDED_RELEASE_TABLET | Freq: Every day | ORAL | 0 refills | Status: AC

## 2018-06-17 MED ORDER — PROMETHAZINE HCL 25 MG PO TABS: 25.0000 mg | ORAL_TABLET | Freq: Three times a day (TID) | ORAL | 0 refills | Status: AC | PRN

## 2018-06-17 MED ORDER — DIPHENHYDRAMINE HCL 50 MG/ML IJ SOLN
12.5000 mg | Freq: Once | INTRAMUSCULAR | Status: AC
  Administered 2018-06-17: 12.5 mg via INTRAVENOUS
  Filled 2018-06-17: qty 1

## 2018-06-17 NOTE — ED Notes (Signed)
To CT

## 2018-06-17 NOTE — ED Triage Notes (Signed)
Pt c.o. Migraine with nausea. HR 88 O2 98 @ check in. Taking zofran and tylenol with no relief. Pt smells of marijuana.

## 2018-06-17 NOTE — ED Triage Notes (Signed)
H/[facility] since Tuesday [facility]

## 2018-06-17 NOTE — ED Provider Notes (Signed)
[facility] EMERGENCY DEPARTMENT Provider Note   CSN: [id number] Arrival date & time: 06/17/18  4098     History   Chief Complaint Chief Complaint  Patient presents with  . Headache    HPI Aaron Shepard is a 27 y.o. male with a hx of tobacco abuse who presents to the ED with complaint of headache x 5 days. Patient states headache had gradual onset w/ steady progression and has been constant since. Headache is located to the frontal and bilateral temple areas, throbbing in nature, without alleviating/aggravating factors. He states that he has had nausea with 2-3 episodes of non bloody emesis associated with this per day since onset. He has noted some congestion as well. He was seen at urgent care for same w/ onset and given toradol and zofran without much change. States zofran prescription may have helped nausea, but he is still vomiting. States he has a hx of semi similar headaches, but not one of that has lasted this long. He has been under increased stress recently. Denies fever, change in vision, numbness, weakness, paresthesias, gait difficulty, confusion, dizziness, abdominal pain, diarrhea, constipation, or dysuria. Denies recent head trauma.   HPI  No past medical history on file.  There are no active problems to display for this patient.   No past surgical history on file.      Home Medications    Prior to Admission medications   Medication Sig Start Date End Date Taking? Authorizing Provider  butalbital-acetaminophen-caffeine (FIORICET, ESGIC) 50-325-40 MG tablet Take 1-2 tablets by mouth every 6 (six) hours as needed for headache. 06/13/18 06/13/19  Eustace Moore, MD  ondansetron (ZOFRAN) 4 MG tablet Take 1 tablet (4 mg total) by mouth every 6 (six) hours. 06/13/18   Eustace Moore, MD    Family History No family history on file.  Social History Social History   Tobacco Use  . Smoking status: Current Every Day Smoker  Substance Use  Topics  . Alcohol use: No  . Drug use: Not on file     Allergies   Patient has no known allergies.   Review of Systems Review of Systems  Constitutional: Negative for fever.  HENT: Positive for congestion.   Eyes: Negative for photophobia and visual disturbance.  Respiratory: Negative for shortness of breath.   Cardiovascular: Negative for chest pain.  Gastrointestinal: Positive for nausea and vomiting. Negative for abdominal pain, blood in stool, constipation and diarrhea.  Genitourinary: Negative for dysuria.  Musculoskeletal: Negative for gait problem.  Neurological: Positive for headaches. Negative for seizures, syncope, facial asymmetry, weakness and numbness.  Psychiatric/Behavioral: Negative for confusion.  All other systems reviewed and are negative.  Physical Exam Updated Vital Signs BP 135/85 (BP Location: Right Arm)   Pulse 77   Temp 98.6 F (37 C) (Oral)   Resp 20   SpO2 98%   Physical Exam  Constitutional: He appears well-developed and well-nourished.  Non-toxic appearance. No distress.  HENT:  Head: Normocephalic and atraumatic.  Right Ear: Tympanic membrane normal. Tympanic membrane is not perforated, not erythematous, not retracted and not bulging.  Left Ear: Tympanic membrane normal. Tympanic membrane is not perforated, not erythematous, not retracted and not bulging.  Nose: Mucosal edema present.  Mouth/Throat: Uvula is midline and oropharynx is clear and moist. No oropharyngeal exudate or posterior oropharyngeal erythema.  Eyes: Pupils are equal, round, and reactive to light. Conjunctivae and EOM are normal. Right eye exhibits no discharge. Left eye exhibits no discharge.  Neck: Normal range of motion. Neck supple. No neck rigidity. No edema and no erythema present.  Cardiovascular: Normal rate and regular rhythm.  No murmur heard. Pulmonary/Chest: Effort normal and breath sounds normal. No respiratory distress. He has no wheezes. He has no rhonchi. He  has no rales.  Respiration even and unlabored  Abdominal: Soft. He exhibits no distension. There is no tenderness. There is no rigidity, no rebound and no guarding.  Lymphadenopathy:    He has no cervical adenopathy.  Neurological:  Alert. Clear speech. No facial droop. CNIII-XII grossly intact. Bilateral upper and lower extremities' sensation grossly intact. 5/5 symmetric strength with grip strength and with plantar and dorsi flexion bilaterally. Patellar DTRs are 2+ and symmetric . Normal finger to nose bilaterally. Negative pronator drift. Negative Romberg sign. Gait is steady and intact.  Skin: Skin is warm and dry. No rash noted.  Psychiatric: He has a normal mood and affect. His behavior is normal. Thought content normal.  Nursing note and vitals reviewed.    ED Treatments / Results  Labs (all labs ordered are listed, but only abnormal results are displayed) Labs Reviewed - No data to display  EKG None  Radiology No results found.  Procedures Procedures (including critical care time)  Medications Ordered in ED Medications - No data to display   Initial Impression / Assessment and Plan / ED Course  I have reviewed the triage vital signs and the nursing notes.  Pertinent labs & imaging results that were available during my care of the patient were reviewed by me and considered in my medical decision making (see chart for details).   Patient presents to the ED with complaint of headache w/ nausea and daily 2-3 episodes of emesis. Patient nontoxic appearing, in no apparent distress, vitals WNL. Exam benign. No neuro deficits. Abdomen non-tender without peritoneal signs.  Given no hx of completely similar headache and 2nd visit for same with obtain CT head, given daily multiple episodes of vomiting x 5 days will obtain abdominal labs. Migraine cocktail w/ fluids benadryl and compazine have been ordered.   Patient headache had gradual onset with steady progression in severity,  he is afebrile with no focal neuro deficits, dizziness, change in vision, proptosis, or nuchal rigidity. CT head normal- no masses, no hemorrhage, no obvious ischemic changes.  Overall presentation seems [facility], ICH, ischemic CVA, dural venous sinus thrombosis, acute glaucoma, giant cell arteritis, mass, or meningitis. Suspect tension vs. Migraine vs sinus type headache. Labs reviewed: leukocytosis at 15.0 noted, seems nonspecific to me at this time, he does have hx of leukocytosis on chart review. No anemia. He does have electrolyte disturbances including hypokalemia at 2.8 (EKG obtained QTc 422), hyponatremic at 134, and hypochloremic at 96, his urine has ketones and protein, suspect findings due to dehydration w/ vomiting and decreased PO intake. Given PO potassium in the ED w/ prescription and diet recommendations also given IV NS. Tolerating PO in the ER. LFTs/lipase WNL, abdomen remains nontender, doubt acute potentially surgical pathology such as cholecystitis, pancreatitis, bowel perf/obstruction, diverticulitis, appendicitis. He is feeling much better after analgesics and antiemetics in the ED. Possible viral process, unclear definitive etiology. Discharge home with supportive measures and PCP follow up.  I discussed results, treatment plan, need for PCP follow-up, and return precautions with the patient. Provided opportunity for questions, patient confirmed understanding and is in agreement with plan.   Final Clinical Impressions(s) / ED Diagnoses   Final diagnoses:  Nonintractable headache, unspecified chronicity  pattern, unspecified headache type  Hypokalemia  Leukocytosis, unspecified type  Non-intractable vomiting with nausea, unspecified vomiting type    ED Discharge Orders         Ordered    promethazine (PHENERGAN) 25 MG tablet  Every 8 hours PRN,   Status:  Discontinued     06/17/18 1103    potassium chloride SA (K-DUR,KLOR-CON) 20 MEQ tablet  Daily,   Status:   Discontinued     06/17/18 1103    naproxen (NAPROSYN) 500 MG tablet  2 times daily     06/17/18 1103    promethazine (PHENERGAN) 25 MG tablet  Every 8 hours PRN     06/17/18 1104    potassium chloride SA (K-DUR,KLOR-CON) 20 MEQ tablet  Daily     06/17/18 [address], Burt R, PA-C 06/17/18 1239    Mancel Bale, MD 06/17/18 1657

## 2018-06-17 NOTE — Discharge Instructions (Addendum)
You were seen in the ER today for a headache with nausea and vomiting.   Tests performed today:  - Head Ct: Normal - Labs: your potassium was low at 2.8- we gave you a prescription for potassium tablets please take this daily for the next several days and include potassium rich foods in your diet. Your sodium and chloride were a bit low and you had some ketones in your urine- we suspect this was due to dehydration and we have given you fluids in the Er. Please have electrolytes rechecked by a primary care provider within 1 week. Your white blood cell count was somewhat high at 15.0- this could be for a variety of reasons but overall we would like this rechecked by a primary care provider in the next few weeks.  - EKG- no significant abnormalities.   We suspect you may have a virus contributing to your symptoms and are sending you home with the following medicines:  - Potassium tablets- to replace low potassium level - Naproxen- is a nonsteroidal anti-inflammatory medication that will help with pain and swelling. Be sure to take this medication as prescribed with food, 1 pill every 12 hours,  It should be taken with food, as it can cause stomach upset, and more seriously, stomach bleeding. Do not take other nonsteroidal anti-inflammatory medications with this such as Advil, Motrin, Aleve, Mobic, Goodie Powder, or Motrin.   - Phenergan- this is an antinausea medication you may take ever 8 hours as needed for vomiting. Do not take zofran with this (the anti-nausea medicine prescribed by urgent care)  You may take the Fioricet tablets given to you by urgent care with these medicines.   We have prescribed you new medication(s) today. Discuss the medications prescribed today with your pharmacist as they can have adverse effects and interactions with your other medicines including over the counter and prescribed medications. Seek medical evaluation if you start to experience new or abnormal symptoms after  taking one of these medicines, seek care immediately if you start to experience difficulty breathing, feeling of your throat closing, facial swelling, or rash as these could be indications of a more serious allergic reaction   Please follow up with primary care within 1 week, if you do not have a primary care follow up with the [place] [facility] or call the number provided in your discharge instructions. Return to the Er for new or worsening symptoms or any other concerns.

## 2019-04-13 IMAGING — CT CT HEAD W/O CM
4 series · 16 of 47 positions shown, 18 images · non-contrast
Comparison: None.

CLINICAL DATA: Headache and nausea

EXAM:
CT HEAD WITHOUT CONTRAST
TECHNIQUE: Contiguous axial images were obtained from the base of the skull
through the vertex without intravenous contrast.

[Series 3: head bone · axial · 0.44mm/px · z∈[-71,-39]mm · 3 of 80 slices shown]
[im 8/80  bone]
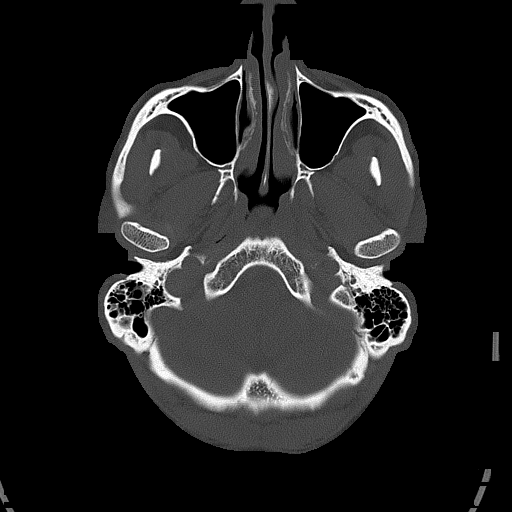
[im 16/80  bone]
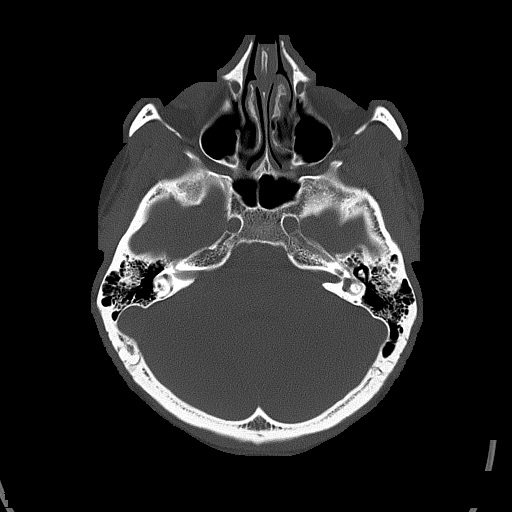
[im 24/80  bone]
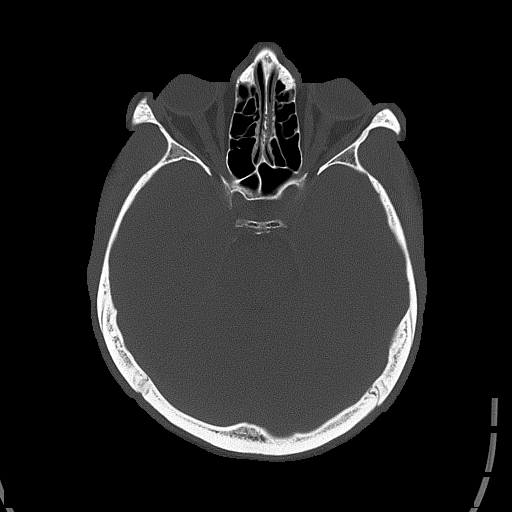

[Series 4: head without · axial · non-contrast · 0.44mm/px · z∈[-70,+50]mm · 7 of 32 slices shown, 9 images]
[im 4/32  brain]
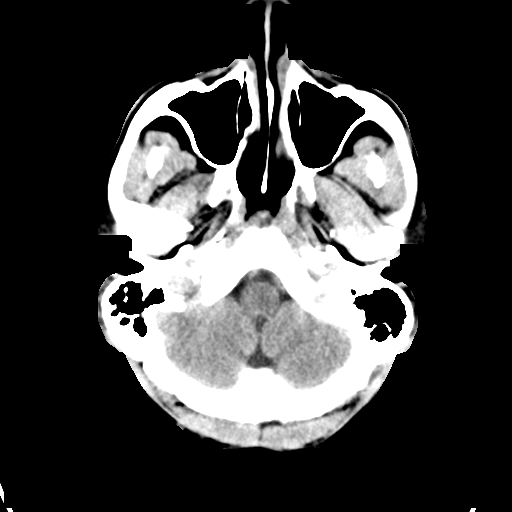
[im 4/32  bone]
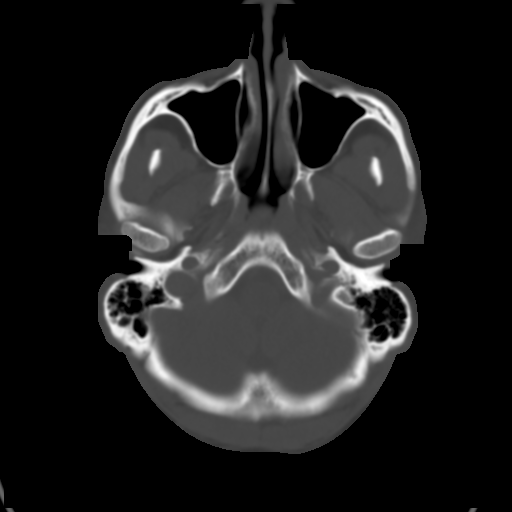
[im 8/32  brain]
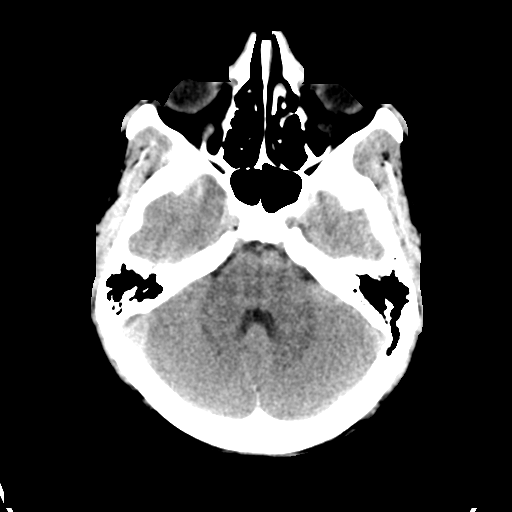
[im 12/32  brain]
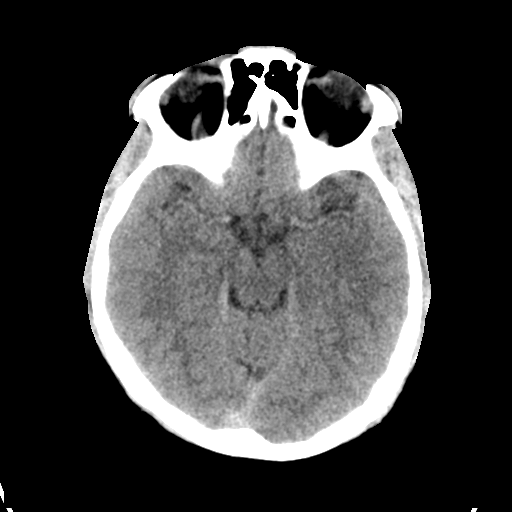
[im 16/32  brain]
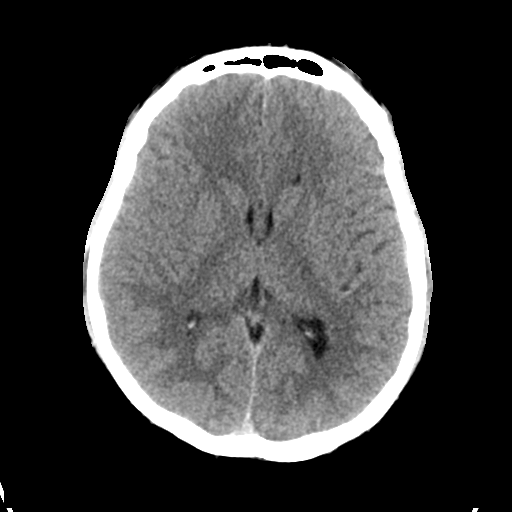
[im 20/32  brain]
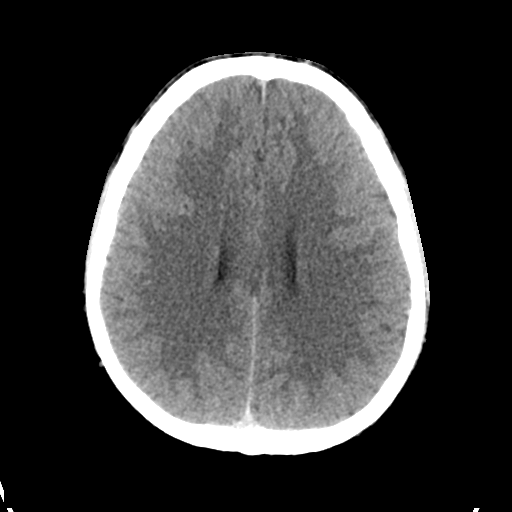
[im 20/32  bone]
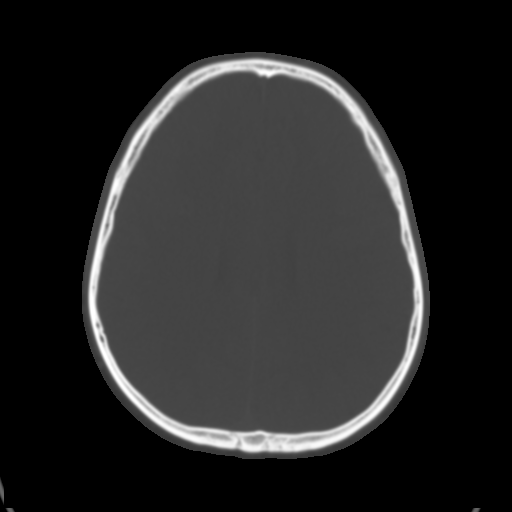
[im 24/32  brain]
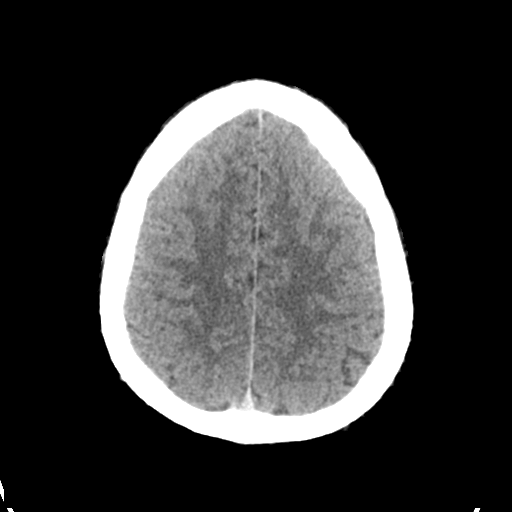
[im 28/32  brain]
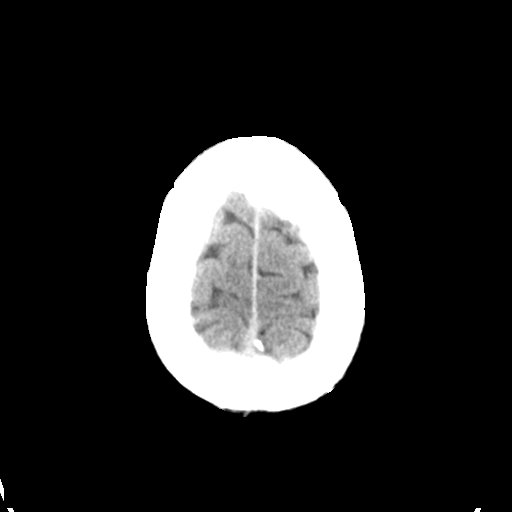

[Series 5: head without cor · coronal · non-contrast · 0.35mm/px · 3 of 64 slices shown]
[im 22/64  brain]
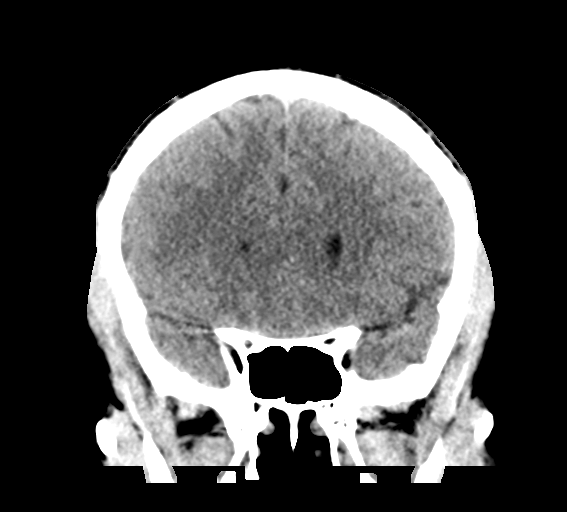
[im 29/64  brain]
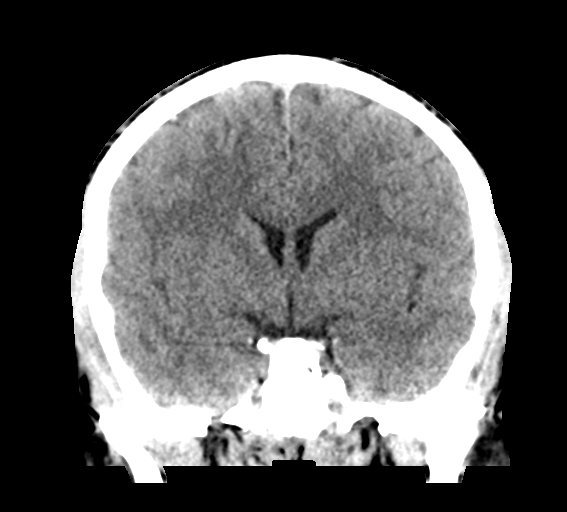
[im 36/64  brain]
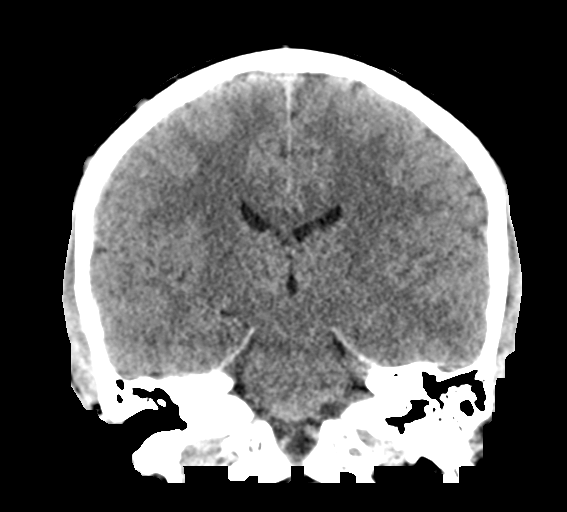

[Series 6: head without sag · sagittal · non-contrast · 0.35mm/px · 3 of 56 slices shown]
[im 19/56  brain]
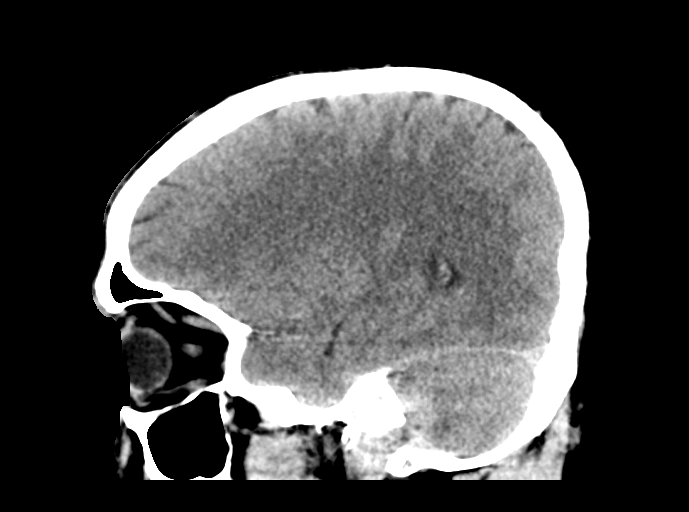
[im 28/56  brain]
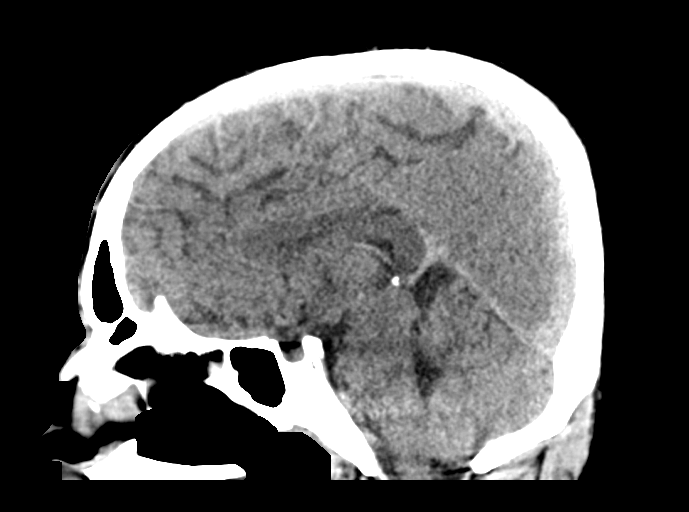
[im 37/56  brain]
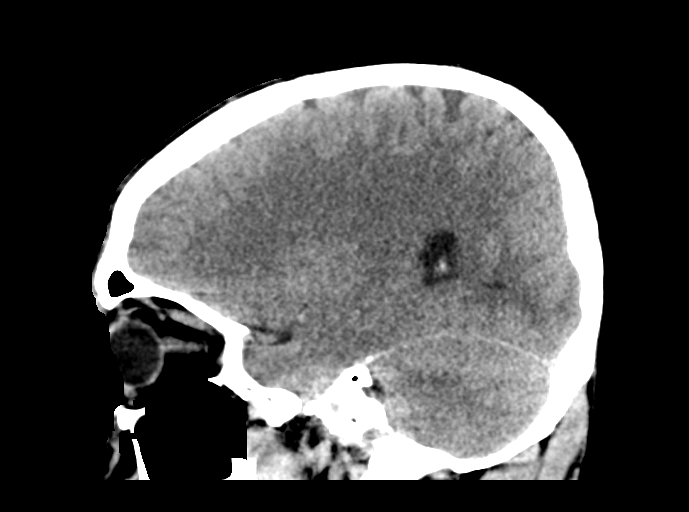

[16 of 47 positions shown; findings below may reference images not displayed]

FINDINGS: Brain: No evidence of acute infarction, hemorrhage, hydrocephalus,
extra-axial collection or mass lesion/mass effect.

Vascular: No hyperdense vessel or unexpected calcification.

Skull: Normal. Negative for fracture or focal lesion.

Sinuses/Orbits: No acute finding.

Other: None.
IMPRESSION: Normal head CT without contrast for age

## 2020-12-29 ENCOUNTER — Encounter: Payer: Self-pay | Admitting: *Deleted

## 2020-12-29 ENCOUNTER — Other Ambulatory Visit: Payer: Self-pay

## 2020-12-29 ENCOUNTER — Ambulatory Visit
Admission: EM | Admit: 2020-12-29 | Discharge: 2020-12-29 | Disposition: A | Discharge status: Home / Self Care | Payer: Self-pay

## 2020-12-29 DIAGNOSIS — T7840XA Allergy, unspecified, initial encounter: Secondary | ICD-10-CM

## 2020-12-29 MED ORDER — DIPHENHYDRAMINE HCL 50 MG/ML IJ SOLN
50.0000 mg | Freq: Once | INTRAMUSCULAR | Status: AC
  Administered 2020-12-29: 50 mg via INTRAMUSCULAR

## 2020-12-29 MED ORDER — METHYLPREDNISOLONE SODIUM SUCC 125 MG IJ SOLR
60.0000 mg | Freq: Once | INTRAMUSCULAR | Status: AC
  Administered 2020-12-29: 60 mg via INTRAMUSCULAR

## 2020-12-29 MED ORDER — FAMOTIDINE 40 MG PO TABS
40.0000 mg | ORAL_TABLET | Freq: Once | ORAL | Status: AC
  Administered 2020-12-29: 40 mg via ORAL

## 2020-12-29 MED ORDER — PREDNISONE 10 MG (21) PO TBPK: ORAL_TABLET | Freq: Every day | ORAL | 0 refills | Status: AC

## 2020-12-29 NOTE — ED Provider Notes (Signed)
[facility]    CSN: [id number] Arrival date & time: 12/29/20  1246      History   Chief Complaint Chief Complaint  Patient presents with   Facial Swelling   Urticaria    HPI Aaron Shepard is a 30 y.o. male presenting with urticaria and facial swelling x1 day.  Patient states that he woke up 1 day ago with urticarial rash on face, neck, hands and facial swelling.  States that his face is red and his lips feel swollen and irritated.  Benadryl has provided minimal relief, last dose was about 20 hours ago.  Patient is adamant that he has not tried new food, has not gone outside.  He has distant history of similar episode as a child, mom states that this was apparently related to measles.  Denies shortness of breath, wheezing, sensation of throat closing, trouble swallowing, dizziness, nausea, vomiting, diarrhea, syncope, chest pain, vision changes.   HPI  History reviewed. No pertinent past medical history.  There are no problems to display for this patient.   History reviewed. No pertinent surgical history.     Home Medications    Prior to Admission medications   Medication Sig Start Date End Date Taking? Authorizing Provider  diphenhydrAMINE HCl (BENADRYL PO) Take by mouth.   Yes [provider]  predniSONE (STERAPRED UNI-PAK 21 TAB) 10 MG (21) TBPK tablet Take by mouth daily. Take 6 tabs by mouth daily  for 2 days, then 5 tabs for 2 days, then 4 tabs for 2 days, then 3 tabs for 2 days, 2 tabs for 2 days, then 1 tab by mouth daily for 2 days 12/29/20  Yes Cheree Ditto, Lyman Speller, PA-C  naproxen (NAPROSYN) 500 MG tablet Take 1 tablet (500 mg total) by mouth 2 (two) times daily. 06/17/18   Petrucelli, Samantha R, PA-C  potassium chloride SA (K-DUR,KLOR-CON) 20 MEQ tablet Take 1 tablet (20 mEq total) by mouth daily. 06/17/18   Petrucelli, Pleas Koch, PA-C  promethazine (PHENERGAN) 25 MG tablet Take 1 tablet (25 mg total) by mouth every 8 (eight) hours as needed for  nausea or vomiting. 06/17/18   Petrucelli, Pleas Koch, PA-C    Family History Family History  Problem Relation Age of Onset   Cancer Mother    Healthy Father     Social History Social History   Tobacco Use   Smoking status: Former    Pack years: 0.00    Types: Cigarettes   Smokeless tobacco: Never  Vaping Use   Vaping Use: Never used  Substance Use Topics   Alcohol use: Not Currently   Drug use: Yes    Types: Marijuana     Allergies   Patient has no known allergies.   Review of Systems Review of Systems  HENT:         Facial swelling  All other systems reviewed and are negative.   Physical Exam Triage Vital Signs ED Triage Vitals  Enc Vitals Group     BP 12/29/20 1317 110/67     Pulse Rate 12/29/20 1317 74     Resp 12/29/20 1317 14     Temp 12/29/20 1317 98 F (36.7 C)     Temp Source 12/29/20 1317 Oral     SpO2 12/29/20 1317 97 %     Weight --      Height --      Head Circumference --      Peak Flow --      Pain  Score 12/29/20 1320 0     Pain Loc --      Pain Edu? --      Excl. in GC? --    No data found.  Updated Vital Signs BP 110/67   Pulse 74   Temp 98 F (36.7 C) (Oral)   Resp 14   SpO2 97%   Visual Acuity Right Eye Distance:   Left Eye Distance:   Bilateral Distance:    Right Eye Near:   Left Eye Near:    Bilateral Near:     Physical Exam Vitals reviewed.  Constitutional:      General: He is not in acute distress.    Appearance: Normal appearance. He is not ill-appearing or diaphoretic.  HENT:     Head: Normocephalic and atraumatic.     Comments: Significant swelling around eyes, but patient able to open eyes and visual acuity grossly intact. Mild facial swelling, lip swelling. No tongue, uvula, pharyngea swelling.  Cardiovascular:     Rate and Rhythm: Normal rate and regular rhythm.     Heart sounds: Normal heart sounds.  Pulmonary:     Effort: Pulmonary effort is normal.     Breath sounds: Normal breath sounds.   Skin:    General: Skin is warm.     Comments: Scattered erythematous urticarial rash on face, neck, hands  Neurological:     General: No focal deficit present.     Mental Status: He is alert and oriented to person, place, and time.  Psychiatric:        Mood and Affect: Mood normal.        Behavior: Behavior normal.        Thought Content: Thought content normal.        Judgment: Judgment normal.     UC Treatments / Results  Labs (all labs ordered are listed, but only abnormal results are displayed) Labs Reviewed - No data to display  EKG   Radiology No results found.  Procedures Procedures (including critical care time)  Medications Ordered in UC Medications  methylPREDNISolone sodium succinate (SOLU-MEDROL) 125 mg/2 mL injection 60 mg (60 mg Intramuscular Given 12/29/20 1326)  diphenhydrAMINE (BENADRYL) injection 50 mg (50 mg Intramuscular Given 12/29/20 1402)  famotidine (PEPCID) tablet 40 mg (40 mg Oral Given 12/29/20 1405)    Initial Impression / Assessment and Plan / UC Course  I have reviewed the triage vital signs and the nursing notes.  Pertinent labs & imaging results that were available during my care of the patient were reviewed by me and considered in my medical decision making (see chart for details).     This patient is a 30 year old male presenting with facial swelling after coming into contact with unknown trigger. No pharyngeal or uvula swelling, oxygenating well on room air without wheezes. This patient does not meet criterion for anaphylaxis as he has normal vitals, no shortness of breath, no wheezing, no dizziness, no nausea and vomiting.  Patient has not yet taken benedyrl today. Solumedrol, Benedryl IM, pepcid administered today. Prednisone taper sent. He is not a diabetic. Friend will drive him home today.  ED return precautions discussed. Patient verbalizes understanding and agreement.   Coding this visit a level 4 for 1 acute illness with  uncertain prognosis that poses threat to life or bodily function, and prescription drug management.  Final Clinical Impressions(s) / UC Diagnoses   Final diagnoses:  Allergic reaction, initial encounter     Discharge Instructions      -  Prednisone taper for allergic reaction. I recommend taking this in the morning as it could give you energy.  -You can take benedryl again starting tomorrow, 1-2 pills daily  -Make sure that a friend or family member drives you to the pharmacy and to home today as the Benadryl administered can make you tired. -Seek additional immediate medical attention if you develop new symptoms like shortness of breath, trouble swallowing, dizziness, nausea.  Have someone drive you to the emergency department or call 911.      ED Prescriptions     Medication Sig Dispense Auth. Provider   predniSONE (STERAPRED UNI-PAK 21 TAB) 10 MG (21) TBPK tablet Take by mouth daily. Take 6 tabs by mouth daily  for 2 days, then 5 tabs for 2 days, then 4 tabs for 2 days, then 3 tabs for 2 days, 2 tabs for 2 days, then 1 tab by mouth daily for 2 days 42 tablet Rhys Martini, PA-C      PDMP not reviewed this encounter.   Rhys Martini, PA-C 12/29/20 1432

## 2020-12-29 NOTE — Discharge Instructions (Addendum)
-  Prednisone taper for allergic reaction. I recommend taking this in the morning as it could give you energy.  -You can take benedryl again starting tomorrow, 1-2 pills daily  -Make sure that a friend or family member drives you to the pharmacy and to home today as the Benadryl administered can make you tired. -Seek additional immediate medical attention if you develop new symptoms like shortness of breath, trouble swallowing, dizziness, nausea.  Have someone drive you to the emergency department or call 911.

## 2020-12-29 NOTE — ED Triage Notes (Signed)
Pt reports waking up yesterday with generalized urticaria and facial swelling.  Face red, c/o lips feeling swollen and irritated, but denies any tongue, inner mouth, or throat sensations.  Has been taking Benadryl without significant relief - last dose last night.  Denies any dyspnea or wheezing.

## 2022-07-06 ENCOUNTER — Other Ambulatory Visit: Payer: Self-pay

## 2022-07-06 ENCOUNTER — Emergency Department (HOSPITAL_COMMUNITY)
Admission: EM | Admit: 2022-07-06 | Discharge: 2022-07-07 | Discharge status: Against Medical Advice | Payer: Self-pay | Attending: Emergency Medicine | Admitting: Emergency Medicine

## 2022-07-06 ENCOUNTER — Ambulatory Visit
Admission: EM | Admit: 2022-07-06 | Discharge: 2022-07-06 | Disposition: A | Discharge status: Home / Self Care | Payer: Self-pay | Attending: Physician Assistant | Admitting: Physician Assistant

## 2022-07-06 ENCOUNTER — Emergency Department (HOSPITAL_COMMUNITY): Payer: Self-pay

## 2022-07-06 DIAGNOSIS — R112 Nausea with vomiting, unspecified: Secondary | ICD-10-CM

## 2022-07-06 DIAGNOSIS — Z5321 Procedure and treatment not carried out due to patient leaving prior to being seen by health care provider: Secondary | ICD-10-CM | POA: Insufficient documentation

## 2022-07-06 DIAGNOSIS — R300 Dysuria: Secondary | ICD-10-CM | POA: Insufficient documentation

## 2022-07-06 DIAGNOSIS — R1030 Lower abdominal pain, unspecified: Secondary | ICD-10-CM | POA: Insufficient documentation

## 2022-07-06 DIAGNOSIS — R109 Unspecified abdominal pain: Secondary | ICD-10-CM

## 2022-07-06 DIAGNOSIS — R8290 Unspecified abnormal findings in urine: Secondary | ICD-10-CM

## 2022-07-06 LAB — POCT URINALYSIS DIP (MANUAL ENTRY)
Glucose, UA: NEGATIVE mg/dL
Nitrite, UA: NEGATIVE
Protein Ur, POC: >=300 mg/dL — AB
Spec Grav, UA: >=1.03 — AB (ref 1.010–1.025)
Urobilinogen, UA: 1 E.U./dL
pH, UA: 6.5 (ref 5.0–8.0)

## 2022-07-06 LAB — BASIC METABOLIC PANEL
Anion gap: 9 (ref 5–15)
BUN: 11 mg/dL (ref 6–20)
CO2: 24 mmol/L (ref 22–32)
Calcium: 9.8 mg/dL (ref 8.9–10.3)
Chloride: 108 mmol/L (ref 98–111)
Creatinine, Ser: 1.35 mg/dL — ABNORMAL HIGH (ref 0.61–1.24)
GFR, Estimated: >60 mL/min (ref >60)
Glucose, Bld: 118 mg/dL — ABNORMAL HIGH (ref 70–99)
Potassium: 4.1 mmol/L (ref 3.5–5.1)
Sodium: 141 mmol/L (ref 135–145)

## 2022-07-06 LAB — CBC
HCT: 49.5 % (ref 39.0–52.0)
Hemoglobin: 16.4 g/dL (ref 13.0–17.0)
MCH: 29.4 pg (ref 26.0–34.0)
MCHC: 33.1 g/dL (ref 30.0–36.0)
MCV: 88.7 fL (ref 80.0–100.0)
Platelets: 308 10*3/uL (ref 150–400)
RBC: 5.58 MIL/uL (ref 4.22–5.81)
RDW: 14.6 % (ref 11.5–15.5)
WBC: 20.1 10*3/uL — ABNORMAL HIGH (ref 4.0–10.5)
nRBC: 0 % (ref 0.0–0.2)

## 2022-07-06 LAB — POCT FASTING CBG KUC MANUAL ENTRY: POCT Glucose (KUC): 127 mg/dL — AB (ref 70–99)

## 2022-07-06 MED ORDER — FENTANYL CITRATE PF 50 MCG/ML IJ SOSY: 50.0000 ug | PREFILLED_SYRINGE | Freq: Once | INTRAMUSCULAR | Status: DC

## 2022-07-06 MED ORDER — ACETAMINOPHEN 500 MG PO TABS
1000.0000 mg | ORAL_TABLET | Freq: Once | ORAL | Status: AC
  Administered 2022-07-06: 1000 mg via ORAL

## 2022-07-06 MED ORDER — ONDANSETRON 4 MG PO TBDP
4.0000 mg | ORAL_TABLET | Freq: Once | ORAL | Status: AC
  Administered 2022-07-06: 4 mg via ORAL

## 2022-07-06 MED ORDER — OXYCODONE-ACETAMINOPHEN 5-325 MG PO TABS: 1.0000 | ORAL_TABLET | Freq: Once | ORAL | Status: DC

## 2022-07-06 MED ORDER — ONDANSETRON HCL 4 MG/2ML IJ SOLN
4.0000 mg | Freq: Once | INTRAMUSCULAR | Status: AC | PRN
  Administered 2022-07-06: 4 mg via INTRAVENOUS
  Filled 2022-07-06: qty 2

## 2022-07-06 NOTE — ED Triage Notes (Signed)
BIB EMS rt flank and lower abd pain x 2 days, Dysuria with N/V , sent from UC.. 10/10  132/80-60-98%

## 2022-07-06 NOTE — ED Triage Notes (Signed)
Pt presents to uc with co of right flank pain and dysuria for 2 days. Pt also reports nausea and vomiting. Pt reports motrin yesterday. Pt reports pain is 10/10 that is constant.

## 2022-07-06 NOTE — ED Provider Notes (Signed)
[facility]    CSN: [id number] Arrival date & time: 07/06/22  1151      History   Chief Complaint Chief Complaint  Patient presents with   Flank Pain    Right     HPI Aaron Shepard is a 31 y.o. male.   Patient here today for evaluation of dysuria, severe right flank pain that started 2 days ago. He reports yesterday he did not have severe pain and symptoms were minimal but today symptoms have worsened. He reports continued nausea and vomiting. He has not taken any medication today. He states pain is a 10/10 and is constant. He does not have fever.    The history is provided by the patient.  Flank Pain Associated symptoms include abdominal pain. Pertinent negatives include no shortness of breath.    History reviewed. No pertinent past medical history.  There are no problems to display for this patient.   History reviewed. No pertinent surgical history.     Home Medications    Prior to Admission medications   Medication Sig Start Date End Date Taking? Authorizing Provider  diphenhydrAMINE HCl (BENADRYL PO) Take by mouth.    [provider]  naproxen (NAPROSYN) 500 MG tablet Take 1 tablet (500 mg total) by mouth 2 (two) times daily. 06/17/18   Petrucelli, Samantha R, PA-C  potassium chloride SA (K-DUR,KLOR-CON) 20 MEQ tablet Take 1 tablet (20 mEq total) by mouth daily. 06/17/18   Petrucelli, Samantha R, PA-C  predniSONE (STERAPRED UNI-PAK 21 TAB) 10 MG (21) TBPK tablet Take by mouth daily. Take 6 tabs by mouth daily  for 2 days, then 5 tabs for 2 days, then 4 tabs for 2 days, then 3 tabs for 2 days, 2 tabs for 2 days, then 1 tab by mouth daily for 2 days 12/29/20   Rhys Martini, PA-C  promethazine (PHENERGAN) 25 MG tablet Take 1 tablet (25 mg total) by mouth every 8 (eight) hours as needed for nausea or vomiting. 06/17/18   Petrucelli, Pleas Koch, PA-C    Family History Family History  Problem Relation Age of Onset   Cancer Mother    Healthy  Father     Social History Social History   Tobacco Use   Smoking status: Former    Types: Cigarettes   Smokeless tobacco: Never  Vaping Use   Vaping Use: Never used  Substance Use Topics   Alcohol use: Not Currently   Drug use: Yes    Types: Marijuana     Allergies   Patient has no known allergies.   Review of Systems Review of Systems  Constitutional:  Negative for chills and fever.  Eyes:  Negative for discharge and redness.  Respiratory:  Negative for shortness of breath.   Gastrointestinal:  Positive for abdominal pain, nausea and vomiting.  Genitourinary:  Positive for dysuria and flank pain.     Physical Exam Triage Vital Signs ED Triage Vitals  Enc Vitals Group     BP 07/06/22 1246 (!) 162/102     Pulse Rate 07/06/22 1230 (!) 56     Resp 07/06/22 1230 (!) 32     Temp 07/06/22 1230 97.6 F (36.4 C)     Temp Source 07/06/22 1230 Oral     SpO2 07/06/22 1230 99 %     Weight --      Height --      Head Circumference --      Peak Flow --  Pain Score 07/06/22 1230 10     Pain Loc --      Pain Edu? --      Excl. in GC? --    No data found.  Updated Vital Signs BP (!) 162/102   Pulse 84   Temp 97.6 F (36.4 C) (Oral)   Resp (!) 22   SpO2 98%      Physical Exam Vitals and nursing note reviewed.  Constitutional:      Comments: Appears to be uncomfortable, audible retching noted from another exam room prior to my exam with patient  HENT:     Head: Normocephalic and atraumatic.  Eyes:     Conjunctiva/sclera: Conjunctivae normal.  Cardiovascular:     Rate and Rhythm: Normal rate.  Pulmonary:     Effort: Pulmonary effort is normal. No respiratory distress.  Neurological:     Mental Status: He is alert.      UC Treatments / Results  Labs (all labs ordered are listed, but only abnormal results are displayed) Labs Reviewed  POCT URINALYSIS DIP (MANUAL ENTRY) - Abnormal; Notable for the following components:      Result Value   Clarity,  UA cloudy (*)    Bilirubin, UA small (*)    Ketones, POC UA >= (160) (*)    Spec Grav, UA >=1.030 (*)    Blood, UA large (*)    Protein Ur, POC >=300 (*)    Leukocytes, UA Trace (*)    All other components within normal limits  POCT FASTING CBG [facility] MANUAL ENTRY - Abnormal; Notable for the following components:   POCT Glucose ([facility]) 127 (*)    All other components within normal limits    EKG   Radiology No results found.  Procedures Procedures (including critical care time)  Medications Ordered in UC Medications  ondansetron (ZOFRAN-ODT) disintegrating tablet 4 mg (4 mg Oral Given 07/06/22 1235)  acetaminophen (TYLENOL) tablet 1,000 mg (1,000 mg Oral Given 07/06/22 1300)    Initial Impression / Assessment and Plan / UC Course  I have reviewed the triage vital signs and the nursing notes.  Pertinent labs & imaging results that were available during my care of the patient were reviewed by me and considered in my medical decision making (see chart for details).  Clinical Course as of 07/06/22 1310  Tue Jul 06, 2022  1248 Ketones, UA(!): >= (160) [RM]    Clinical Course User Index [RM] Tomi Bamberger, PA-C   Patient requesting pain meds while in office- tylenol administered given concerns for kidney dysfunction based on UA in office. Recommended further evaluation in the ED- patient prefers EMS transport. Zofran and oral acetaminophen administered in office prior to transport, IV access obtained, CBG- 127 in office.   Final Clinical Impressions(s) / UC Diagnoses   Final diagnoses:  Right flank pain  Nausea and vomiting, unspecified vomiting type  Abnormal urine findings   Discharge Instructions   None    ED Prescriptions   None    PDMP not reviewed this encounter.   Tomi Bamberger, PA-C 07/06/22 1311

## 2022-07-06 NOTE — ED Provider Triage Note (Signed)
Emergency Medicine Provider Triage Evaluation Note  Aaron Shepard , a 31 y.o. male  was evaluated in triage.  Pt complains of right flank pain x 2 days, progressed to lower abdominal pain. Went to UC, sent here for concern of kidney injury.   Review of Systems  Positive: Abdominal pain, R flank pain, dysuria, dark urine, nausea, vomiting Negative: Fever, diarrhea, hematuria  Physical Exam  BP (!) 152/94   Pulse (!) 51   Temp 98 F (36.7 C) (Oral)   Resp 16   SpO2 100%  Gen:   Awake, no distress   Resp:  Normal effort  MSK:   Moves extremities without difficulty  Other:    Medical Decision Making  Medically screening exam initiated at 2:54 PM.  Appropriate orders placed.  Aaron Shepard was informed that the remainder of the evaluation will be completed by another provider, this initial triage assessment does not replace that evaluation, and the importance of remaining in the ED until their evaluation is complete.  Given zofran upon arrival. Will initiate workup and give pain medication. Imaging ordered to rule out kidney stone   Nasiah Lehenbauer T, PA-C 07/06/22 1455

## 2022-07-06 NOTE — ED Notes (Signed)
Pt requested to speak to the charge nurse. He expressed frustration with the wait time. He states, "I seen people get seen with lesser shit that me." He requested that I remove his IV. He was encouraged to stay and complete treatment.

## 2022-07-06 NOTE — ED Notes (Signed)
Patient is being discharged from the Urgent Care and sent to the Emergency Department via [facility] . Per Provider Erma Pinto, patient is in need of higher level of care due to severe RLQ abdominal pain, hypertension, vomiting. Patient is aware and verbalizes understanding of plan of care.   Vitals:   07/06/22 1230 07/06/22 1246  BP:  (!) 162/102  Pulse: (!) 56 84  Resp: (!) 32 (!) 22  Temp: 97.6 F (36.4 C)   SpO2: 99% 98%
